# Patient Record
Sex: Female | Born: 1975 | Race: White | Hispanic: Yes | Marital: Married | Smoking: Never smoker
Health system: Southern US, Community
[De-identification: ages and names within clinical notes are randomized; demographics above are authoritative.]

---

## 1998-05-30 ENCOUNTER — Emergency Department (HOSPITAL_COMMUNITY): Admission: EM | Admit: 1998-05-30 | Discharge: 1998-05-30 | Payer: Self-pay | Admitting: Emergency Medicine

## 1999-11-07 ENCOUNTER — Emergency Department (HOSPITAL_COMMUNITY): Admission: EM | Admit: 1999-11-07 | Discharge: 1999-11-07 | Payer: Self-pay | Admitting: Internal Medicine

## 2000-01-26 ENCOUNTER — Other Ambulatory Visit: Admission: RE | Admit: 2000-01-26 | Discharge: 2000-01-26 | Payer: Self-pay | Admitting: Gynecology

## 2001-08-23 ENCOUNTER — Inpatient Hospital Stay (HOSPITAL_COMMUNITY): Admission: AD | Admit: 2001-08-23 | Discharge: 2001-08-23 | Payer: Self-pay | Admitting: Obstetrics & Gynecology

## 2002-06-11 ENCOUNTER — Inpatient Hospital Stay: Admission: AD | Admit: 2002-06-11 | Discharge: 2002-06-11 | Payer: Self-pay | Admitting: Obstetrics and Gynecology

## 2002-11-13 ENCOUNTER — Inpatient Hospital Stay (HOSPITAL_COMMUNITY): Admission: AD | Admit: 2002-11-13 | Discharge: 2002-11-15 | Payer: Self-pay | Admitting: Obstetrics and Gynecology

## 2004-06-01 ENCOUNTER — Encounter: Admission: RE | Admit: 2004-06-01 | Discharge: 2004-06-01 | Payer: Self-pay | Admitting: *Deleted

## 2004-06-03 ENCOUNTER — Ambulatory Visit: Payer: Self-pay | Admitting: *Deleted

## 2004-06-04 ENCOUNTER — Ambulatory Visit: Payer: Self-pay | Admitting: Family Medicine

## 2004-06-04 ENCOUNTER — Inpatient Hospital Stay (HOSPITAL_COMMUNITY): Admission: AD | Admit: 2004-06-04 | Discharge: 2004-06-06 | Payer: Self-pay | Admitting: *Deleted

## 2006-04-05 ENCOUNTER — Ambulatory Visit (HOSPITAL_COMMUNITY): Admission: RE | Admit: 2006-04-05 | Discharge: 2006-04-05 | Payer: Self-pay | Admitting: Family Medicine

## 2007-10-29 ENCOUNTER — Inpatient Hospital Stay (HOSPITAL_COMMUNITY): Admission: AD | Admit: 2007-10-29 | Discharge: 2007-10-29 | Payer: Self-pay | Admitting: Obstetrics

## 2007-11-14 ENCOUNTER — Inpatient Hospital Stay (HOSPITAL_COMMUNITY): Admission: AD | Admit: 2007-11-14 | Discharge: 2007-11-19 | Payer: Self-pay | Admitting: Gynecology

## 2007-11-16 ENCOUNTER — Encounter: Payer: Self-pay | Admitting: Obstetrics

## 2008-07-21 ENCOUNTER — Emergency Department (HOSPITAL_COMMUNITY): Admission: EM | Admit: 2008-07-21 | Discharge: 2008-07-21 | Payer: Self-pay | Admitting: Emergency Medicine

## 2008-12-28 ENCOUNTER — Ambulatory Visit (HOSPITAL_COMMUNITY): Admission: RE | Admit: 2008-12-28 | Discharge: 2008-12-28 | Payer: Self-pay | Admitting: Obstetrics & Gynecology

## 2009-03-19 ENCOUNTER — Inpatient Hospital Stay (HOSPITAL_COMMUNITY): Admission: RE | Admit: 2009-03-19 | Discharge: 2009-03-22 | Payer: Self-pay | Admitting: Obstetrics & Gynecology

## 2009-03-19 ENCOUNTER — Ambulatory Visit: Payer: Self-pay | Admitting: Obstetrics & Gynecology

## 2009-04-07 ENCOUNTER — Ambulatory Visit: Payer: Self-pay | Admitting: Family Medicine

## 2009-04-07 ENCOUNTER — Inpatient Hospital Stay (HOSPITAL_COMMUNITY): Admission: AD | Admit: 2009-04-07 | Discharge: 2009-04-11 | Payer: Self-pay | Admitting: Obstetrics and Gynecology

## 2009-04-12 ENCOUNTER — Inpatient Hospital Stay (HOSPITAL_COMMUNITY): Admission: AD | Admit: 2009-04-12 | Discharge: 2009-04-12 | Payer: Self-pay | Admitting: Obstetrics & Gynecology

## 2009-04-12 ENCOUNTER — Encounter: Payer: Self-pay | Admitting: *Deleted

## 2009-04-14 ENCOUNTER — Ambulatory Visit: Payer: Self-pay | Admitting: Family Medicine

## 2009-04-14 DIAGNOSIS — O871 Deep phlebothrombosis in the puerperium: Secondary | ICD-10-CM | POA: Insufficient documentation

## 2009-04-14 LAB — CONVERTED CEMR LAB: INR: 5

## 2009-04-16 ENCOUNTER — Ambulatory Visit: Payer: Self-pay | Admitting: Family Medicine

## 2009-04-19 ENCOUNTER — Ambulatory Visit: Payer: Self-pay | Admitting: Family Medicine

## 2009-04-19 LAB — CONVERTED CEMR LAB: INR: 2.9

## 2009-04-23 ENCOUNTER — Ambulatory Visit: Payer: Self-pay | Admitting: Family Medicine

## 2009-04-26 ENCOUNTER — Ambulatory Visit: Payer: Self-pay | Admitting: Family Medicine

## 2009-04-26 LAB — CONVERTED CEMR LAB: INR: 1.4

## 2009-04-29 ENCOUNTER — Ambulatory Visit: Payer: Self-pay | Admitting: Family Medicine

## 2009-05-06 ENCOUNTER — Ambulatory Visit: Payer: Self-pay | Admitting: Family Medicine

## 2009-05-06 LAB — CONVERTED CEMR LAB: INR: 2.5

## 2009-05-13 ENCOUNTER — Encounter: Payer: Self-pay | Admitting: Family Medicine

## 2009-05-13 ENCOUNTER — Ambulatory Visit: Payer: Self-pay | Admitting: Family Medicine

## 2009-05-13 DIAGNOSIS — R109 Unspecified abdominal pain: Secondary | ICD-10-CM

## 2009-05-13 DIAGNOSIS — R209 Unspecified disturbances of skin sensation: Secondary | ICD-10-CM | POA: Insufficient documentation

## 2009-05-13 DIAGNOSIS — R42 Dizziness and giddiness: Secondary | ICD-10-CM

## 2009-05-13 LAB — CONVERTED CEMR LAB: INR: 2

## 2009-05-14 LAB — CONVERTED CEMR LAB
Basophils Absolute: 0 10*3/uL (ref 0.0–0.1)
Chlamydia, DNA Probe: NEGATIVE
Eosinophils Absolute: 0.1 10*3/uL (ref 0.0–0.7)
Eosinophils Relative: 2 % (ref 0–5)
HCT: 41.6 % (ref 36.0–46.0)
Hemoglobin: 13.6 g/dL (ref 12.0–15.0)
Neutrophils Relative %: 64 % (ref 43–77)
WBC: 7.1 10*3/uL (ref 4.0–10.5)

## 2009-05-18 ENCOUNTER — Inpatient Hospital Stay (HOSPITAL_COMMUNITY): Admission: AD | Admit: 2009-05-18 | Discharge: 2009-05-19 | Payer: Self-pay | Admitting: Family Medicine

## 2009-05-19 ENCOUNTER — Ambulatory Visit: Payer: Self-pay | Admitting: Family Medicine

## 2009-05-26 ENCOUNTER — Ambulatory Visit: Payer: Self-pay | Admitting: Family Medicine

## 2009-05-26 LAB — CONVERTED CEMR LAB: INR: 2.5

## 2009-06-16 ENCOUNTER — Ambulatory Visit: Payer: Self-pay | Admitting: Family Medicine

## 2009-06-21 ENCOUNTER — Ambulatory Visit: Payer: Self-pay | Admitting: Family Medicine

## 2009-06-25 ENCOUNTER — Ambulatory Visit: Payer: Self-pay | Admitting: Family Medicine

## 2009-07-02 ENCOUNTER — Ambulatory Visit: Payer: Self-pay | Admitting: Family Medicine

## 2009-07-02 LAB — CONVERTED CEMR LAB: INR: 3.7

## 2009-07-07 ENCOUNTER — Ambulatory Visit: Payer: Self-pay | Admitting: Family Medicine

## 2009-10-15 ENCOUNTER — Encounter: Admission: RE | Admit: 2009-10-15 | Discharge: 2009-10-15 | Payer: Self-pay | Admitting: General Surgery

## 2010-01-11 ENCOUNTER — Encounter: Payer: Self-pay | Admitting: Family Medicine

## 2010-01-11 ENCOUNTER — Ambulatory Visit: Payer: Self-pay | Admitting: Family Medicine

## 2010-01-11 DIAGNOSIS — K047 Periapical abscess without sinus: Secondary | ICD-10-CM

## 2010-03-27 IMAGING — CT CT ABDOMEN W/ CM
3 of 5 series · 14 of 32 positions shown, 19 images · IV contrast (40ML OMNI-MIX & [ID] OMNIP 300%)
Comparison: None

CT ABDOMEN

CLINICAL DATA: Fever, abdominal pain, status post cesarean section
with tubal ligation and hernia repair 03/19/2009

CT ABDOMEN AND PELVIS WITH CONTRAST
TECHNIQUE: Multidetector CT imaging of the abdomen and pelvis was
performed using the standard protocol following bolus
administration of intravenous contrast.  Sagittal and coronal MPR
images reconstructed from axial data set.
Contrast: Dilute oral contrast and 100 ml Qmnipaque-STT

[Series 2: abd pelvis · axial · 0.73mm/px · z∈[-350,-65]mm · 5 of 83 slices shown, 10 images]
[im 14/83  soft-tissue]
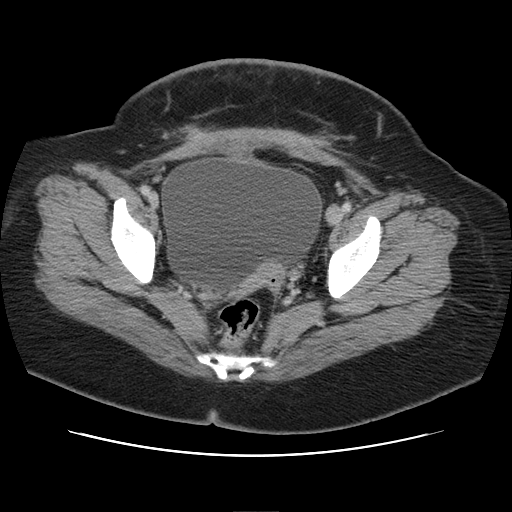
[im 14/83  bone]
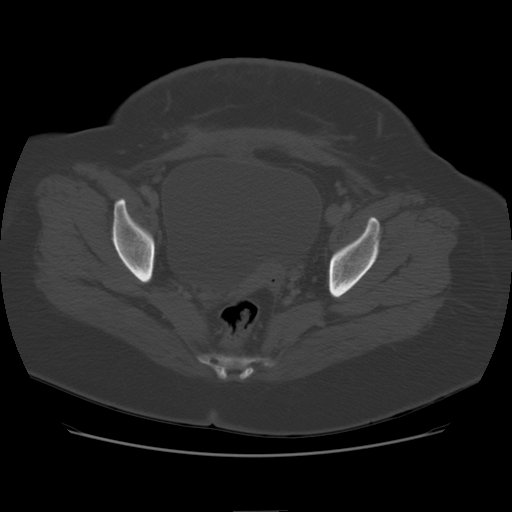
[im 28/83  soft-tissue]
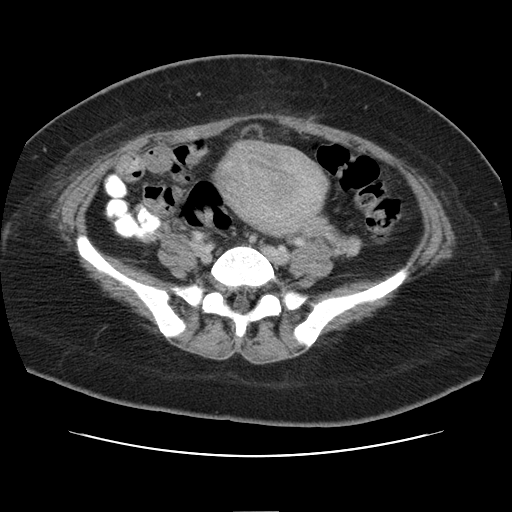
[im 28/83  lung]
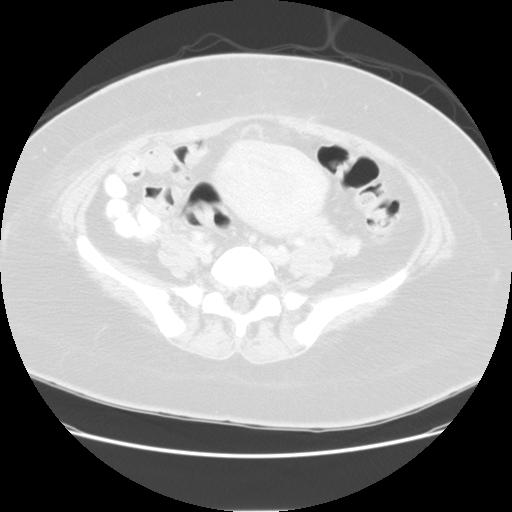
[im 42/83  soft-tissue]
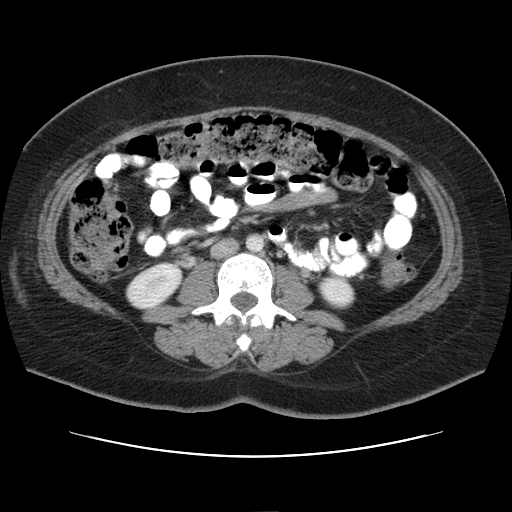
[im 42/83  lung]
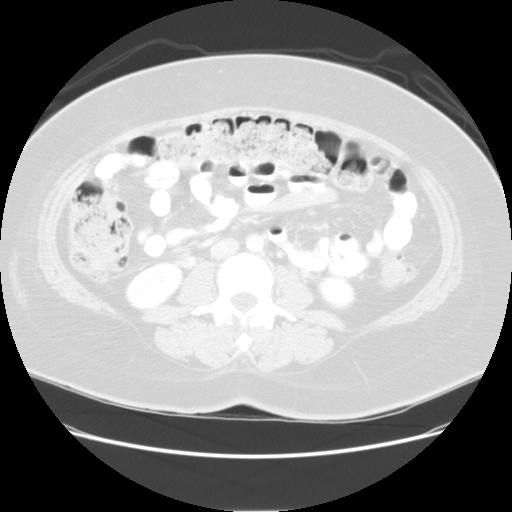
[im 55/83  soft-tissue]
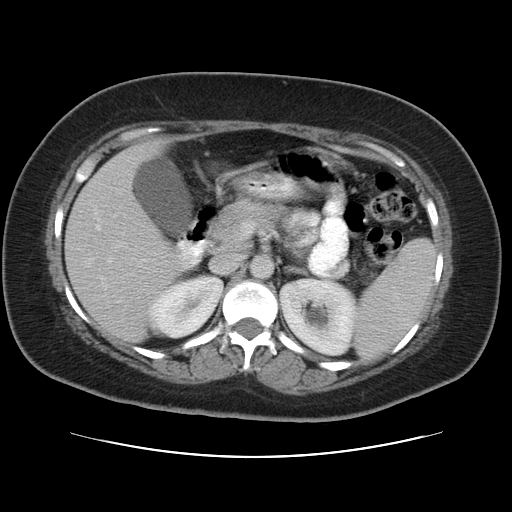
[im 55/83  lung]
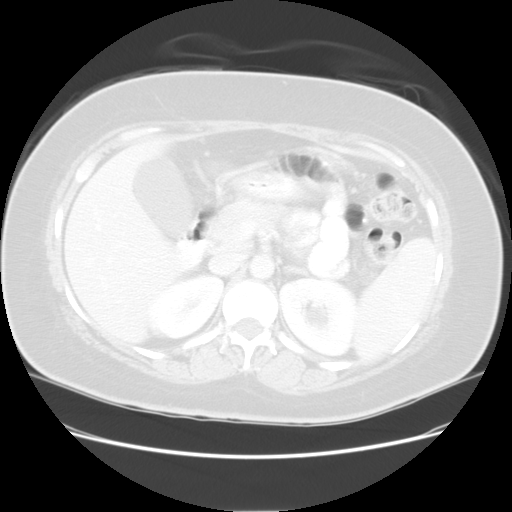
[im 69/83  soft-tissue]
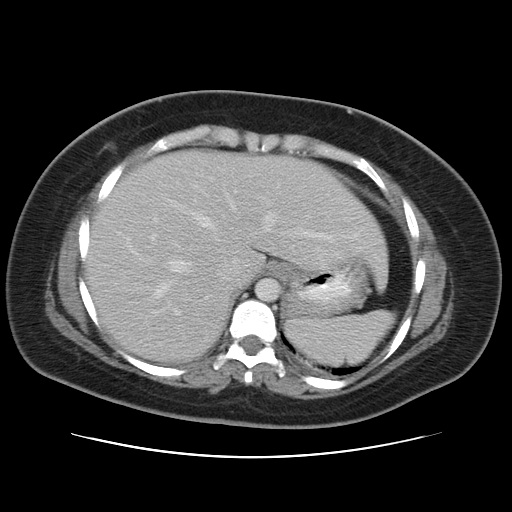
[im 69/83  lung]
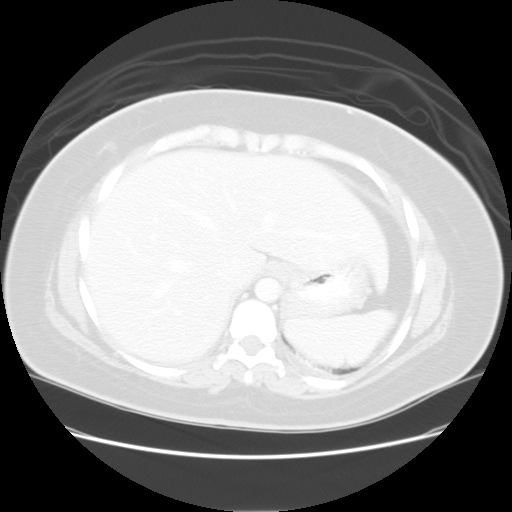

[Series 5: renal delay · axial · delayed · 0.70mm/px · z∈[-325,-155]mm · 3 of 61 slices shown]
[im 16/61  soft-tissue]
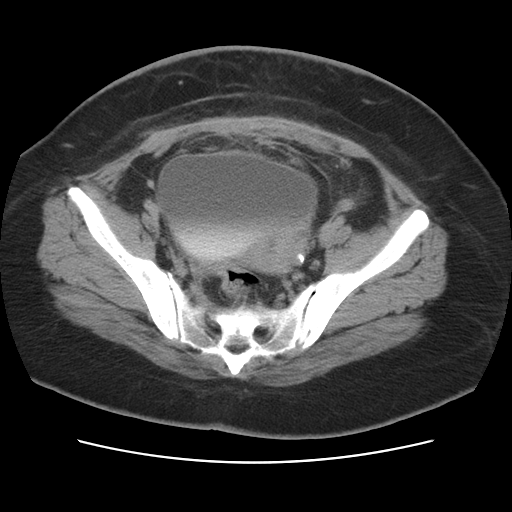
[im 31/61  soft-tissue]
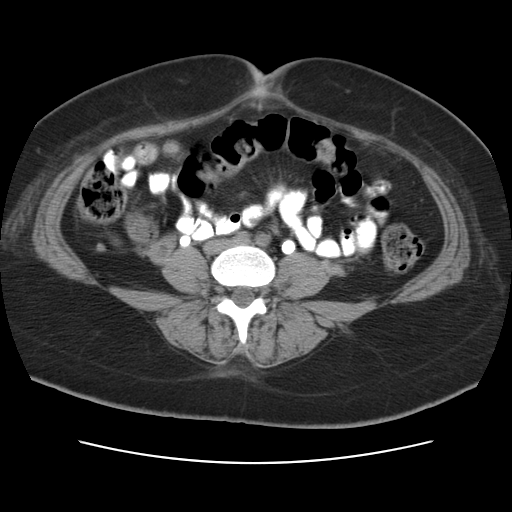
[im 46/61  soft-tissue]
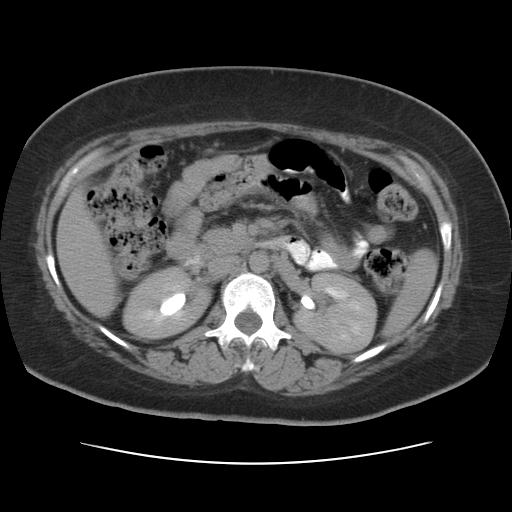

[Series 400: reformatted · coronal · 0.85mm/px · 6 of 130 slices shown]
[im 15/130  soft-tissue]
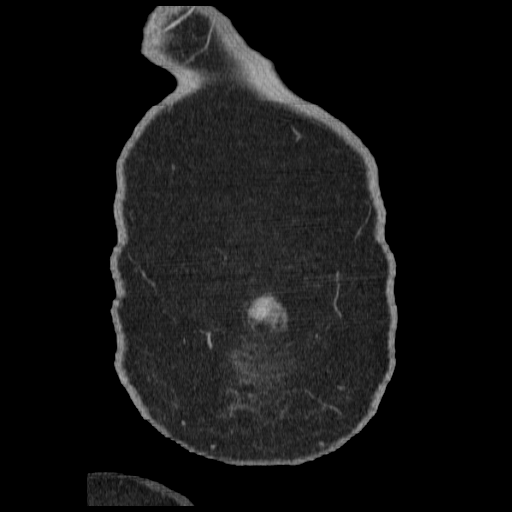
[im 29/130  soft-tissue]
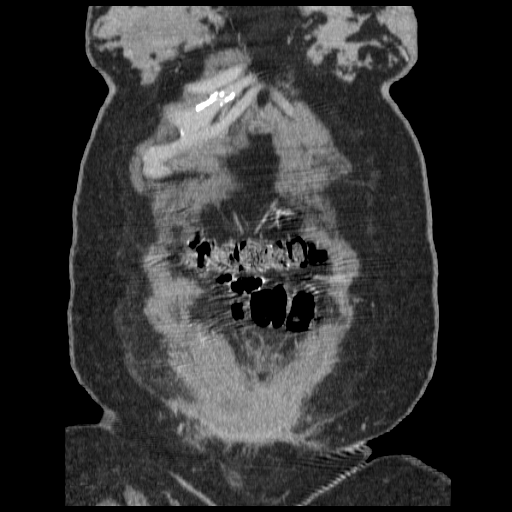
[im 44/130  soft-tissue]
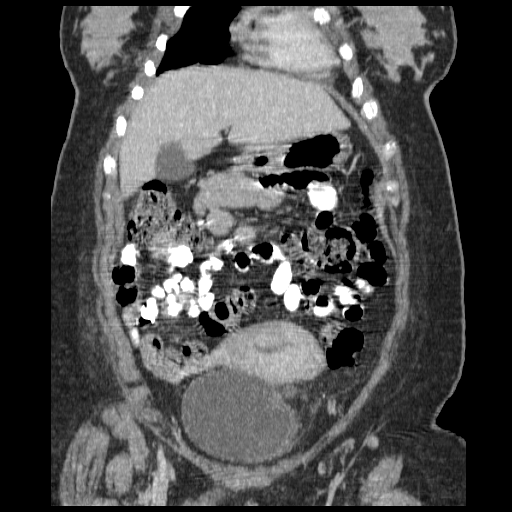
[im 58/130  soft-tissue]
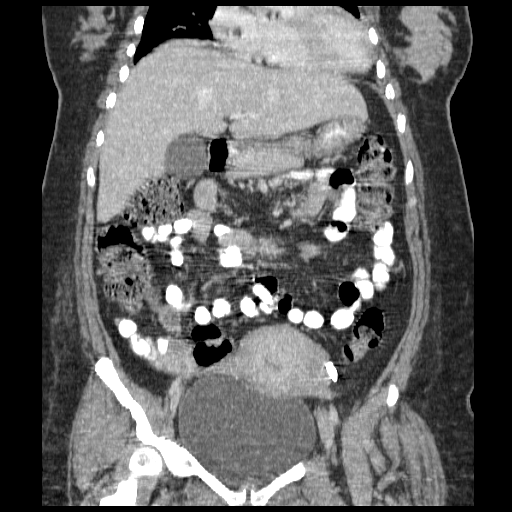
[im 72/130  soft-tissue]
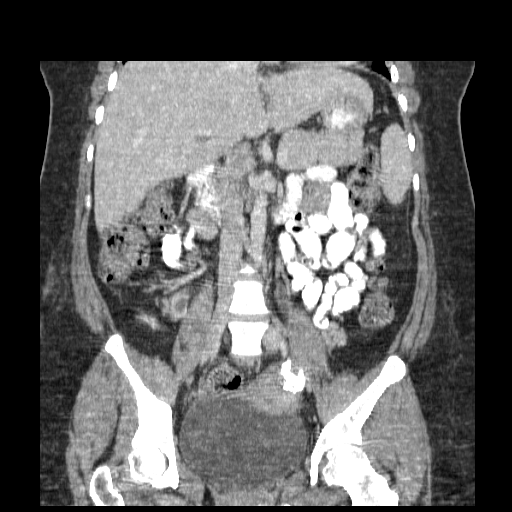
[im 87/130  soft-tissue]
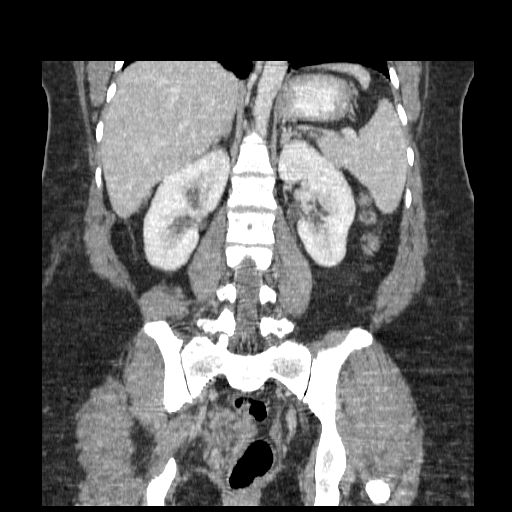

[14 of 32 positions shown; findings below may reference images not displayed]

FINDINGS: Minimal dependent atelectasis and pleural fluid at lung bases.
Liver, spleen, pancreas, and adrenal glands normal appearance.
Minimal fullness of renal collecting systems bilaterally,
potentially related to recent pregnancy.
Questionably tiny focus of gas versus minimal fat adjacent to
gallbladder image 28.
No upper abdominal mass, adenopathy, or free fluid.
Thrombus identified within right ovarian vein, nonocclusive.
No extension of thrombus into the right renal vein.
Minimal stranding at umbilicus.
IMPRESSION: Minimal bibasilar atelectasis.
Right ovarian vein thrombosis, nonocclusive.
Minimal fullness of renal collecting systems without gross
obstruction/hydronephrosis.

CT PELVIS
FINDINGS: Right ovarian vein thrombosis extends to right ovary.
Central low attenuation within uterus and slightly prominent
uterine size are likely related to postpartum state.
Calcified uterine fibroid left lateral aspect of uterus.
Stranding in prevesical space compatible with preceding surgery.
Bladder and distal ureters normal.
No pelvic mass, adenopathy, free fluid or abscess.
Pelvic bowel loops unremarkable.
Bones unremarkable.
IMPRESSION: Right ovarian vein thrombosis.
Expected postsurgical changes in pelvis with note of calcified
uterine fibroid.

Critical test results telephoned to Dr. Gantz at the time of
interpretation on 04/08/2009 at 6636 hours.

## 2010-06-09 ENCOUNTER — Ambulatory Visit: Payer: Self-pay | Admitting: Family Medicine

## 2010-06-09 ENCOUNTER — Encounter: Payer: Self-pay | Admitting: Family Medicine

## 2010-07-28 ENCOUNTER — Ambulatory Visit: Payer: Self-pay | Admitting: Family Medicine

## 2010-08-22 ENCOUNTER — Ambulatory Visit: Payer: Self-pay | Admitting: Family Medicine

## 2010-08-22 LAB — CONVERTED CEMR LAB: Pap Smear: NEGATIVE

## 2010-11-01 NOTE — Assessment & Plan Note (Signed)
Summary: dental pain/[place]/mayans   Vital Signs:  Patient profile:   35 year old female Height:      55 inches Weight:      154 pounds BMI:     35.92 Temp:     98.6 degrees F oral Pulse rate:   68 / minute BP sitting:   118 / 70  (left arm) Cuff size:   regular  Vitals Entered By: Tessie Fass CMA (January 11, 2010 2:57 PM) CC: dental pain   Primary Care Provider:  Ardeen Garland  MD  CC:  dental pain.  History of Present Illness: 85 YOF w/ 2 wk hx/o persistent dental pain. Pt states pain prominent across lower teeth in L and R jaw. Pt immigrant from [place] and states that she has never seen a Education officer, community before. Pt denies any fever. Pt does report pain w/ opening w/ mouth. However neck range of motion WNL, no nuchal rigidity. No odynophagia. Pt w/ adequate by mouth intake since swelling and pain began. HAs been using as needed tylenol for pain w/ minimal relief.   Allergies: No Known Drug Allergies  Physical Exam  General:  alert and overweight-appearing.  in minimal distress secondary to pain.  Mouth:  Erythema and swelling in L and R jaw. No tonsillar exudate.  Neck:  bilateral cervical LAD (minimal) Lungs:  normal respiratory effort, no intercostal retractions, and no accessory muscle use.     Impression & Recommendations:  Problem # 1:  ABSCESS, TOOTH (ICD-522.5) Pt w/ likely dental abscess. Plan to treat w/ Pen VK as well as as needed vicodin for pain. Referral made for dental clinic. Pt instructed that it may make time span of months to get in to clinic. Pen VK will treat tooth abscess, however if sxs recur in next 2-3 months nefore getting to dental clinic to refill Pen VK Rx and repeat treatmen course.  Orders: Dental Referral (Dentist) [facility]- Est Level  3 ([address])  Complete Medication List: 1)  Coumadin 2.5 Mg Tabs (Warfarin sodium) .... Use as directed 2)  Meclizine Hcl 25 Mg Tabs (Meclizine hcl) .... Take one by mouth two times a day as needed vertigo.  label in  spanish 3)  Omeprazole 40 Mg Cpdr (Omeprazole) .... Take one daily 4)  Tums 500 Mg Chew (Calcium carbonate antacid) .... Take one three times a day before meals. qs 1 month 5)  Penicillin V Potassium 500 Mg Tabs (Penicillin v potassium) .Marland Kitchen.. 1 pill three times a day for 14 days 6)  Vicodin 5-500 Mg Tabs (Hydrocodone-acetaminophen) .Marland Kitchen.. 1 pill every 4-6 hours for pain Prescriptions: PENICILLIN V POTASSIUM 500 MG TABS (PENICILLIN V POTASSIUM) 1 pill three times a day for 14 days  #42 x 2   Entered and Authorized by:   Doree Albee MD   Signed by:   Doree Albee MD on 01/11/2010   Method used:   Electronically to        [facility] [phone]* (retail)       [address]       [place], [place]  [address]       Ph: [phone]       Fax: [phone]   RxID:   [id number] VICODIN 5-500 MG TABS (HYDROCODONE-ACETAMINOPHEN) 1 pill every 4-6 hours for pain  #60 x 0   Entered and Authorized by:   Doree Albee MD   Signed by:   Doree Albee MD on 01/11/2010   Method used:   Printed then faxed to .Marland KitchenMarland Kitchen  [facility] [address] [phone]* (retail)       [address]       [place], [place]  [address]       Ph: [phone]       Fax: [phone]   RxID:   [id number] PENICILLIN V POTASSIUM 500 MG TABS (PENICILLIN V POTASSIUM) 1 pill three times a day for 14 days  #42 x 2   Entered and Authorized by:   Doree Albee MD   Signed by:   Doree Albee MD on 01/11/2010   Method used:   Electronically to        [facility] [phone]* (retail)       [address]       [place], [place]  [address]       Ph: [phone]       Fax: [phone]   RxID:   [id number]

## 2010-11-01 NOTE — Miscellaneous (Signed)
Summary: walk in  Clinical Lists Changes came in with another who spoke english. has dental pain & swelling on both sides of her mouth. pain x 4 days. using tylenol & ibuprofen. placed in work in & interpretor arranged.Golden Circle RN  January 11, 2010 2:21 PM

## 2010-11-01 NOTE — Miscellaneous (Signed)
Summary: walk in  Clinical Lists Changes came in c/o tooth pain on l side extending to behind her l ear. has tried tylenol & ibu with no relief. states it has been hurting 2-3 days. work in at [employer] with Dr. [place].Golden Circle RN  June 09, 2010 9:23 AM

## 2010-11-01 NOTE — Assessment & Plan Note (Signed)
Summary: complete physical   Vital Signs:  Patient profile:   35 year old female Height:      55 inches Weight:      152 pounds BMI:     35.46 Pulse (ortho):   74 / minute BP sitting:   114 / 77  (right arm)  Vitals Entered By: Renato Battles slade,cma CC: physical with pap Is Patient Diabetic? No Pain Assessment Patient in pain? no        Primary Care Earnest Mcgillis:  Ardeen Garland  MD  CC:  physical with pap.  History of Present Illness: Pt states that she is her for complete physical and has no complaints today.  At last visit was here for lower abd pain.  This has now completely resolved.  Pt states that her last pap was 1-2 years ago and that she has never had an abnormal pap.  Pt does not know if her vaccinations are uptodate.  She does not have a shot record from [place].    Habits & Providers  Alcohol-Tobacco-Diet     Tobacco Status: never  Current Medications (verified): 1)  None  Allergies (verified): No Known Drug Allergies  Family History: father- stroke mother- healthy siblings- healthy  Social History: G5P5 (youngest 03/2009).  no smoking. no alcohol. no drugs. no smoking.  has 3 years of education- was in and out of school as child since she had to stay at home and work- taught self to read.  Review of Systems       no cp, no sob, no palpitations, no bowel or bladder problems.  Physical Exam  General:  VSS Well-developed,well-nourished,in no acute distress; alert,appropriate and cooperative throughout examination Breasts:  No mass, nodules, thickening, tenderness, bulging, retraction, inflamation, nipple discharge or skin changes noted.   Lungs:  Normal respiratory effort, chest expands symmetrically. Lungs are clear to auscultation, no crackles or wheezes. Heart:  Normal rate and regular rhythm. S1 and S2 normal without gallop, murmur, click, rub or other extra sounds. Abdomen:  abdomen soft and non-tender without masses, organomegaly or hernias  noted. Genitalia:  Pelvic Exam:        External: normal female genitalia without lesions or masses        Vagina: normal without lesions or masses        Cervix: normal without lesions or masses        Adnexa: normal bimanual exam without masses or fullness        Uterus: normal by palpation        Pap smear: performed Msk:  normal gait. full rom of all ext. Extremities:  no edema Skin:  Intact without suspicious lesions or rashes Psych:  Cognition and judgment appear intact. Alert and cooperative with normal attention span and concentration. No apparent delusions, illusions, hallucinations   Impression & Recommendations:  Problem # 1:  HEALTH MAINTENANCE EXAM (ICD-V70.0) pap obtained and sent to lab.  pt does not have shot records. could review maternity records ( at another office) to see if varicella or rubella titers drawn or we consider drawing these titers if the pt was interested. will discuss further at next years wellness visit.  tetanus 2 years ago.  flu shot given today.  No lab work screening needed since pt healthy and no significant family hx.  advised weight loss.  recommended meeting with the nutritionist if she desires.   pt states her goal weight is 130 lbs.  recommended weight per bmi is 110lbs.  will aim for pt's  personal weight goal first.  pt to return as needed or in 1 year for next wellness exam.    Orders: [facility] - Est  18-39 yrs [phone])  Other Orders: Pap Smear-[facility] ([id number]) Influenza Vaccine NON MCR (86578)  Patient Instructions: 1)  return in 1 year for your next wellness exam   Orders Added: 1)  Pap Smear-[facility] [[id number]] 2)  Influenza Vaccine NON MCR [00028] 3)  [facility] - Est  18-39 yrs [[id number]]   Immunizations Administered:  Influenza Vaccine # 1:    Vaccine Type: Fluvax Non-MCR    Site: left deltoid    Mfr: GlaxoSmithKline    Dose: 0.5 ml    Route: IM    Given by: Arlyss Repress CMA,    Exp. Date: 04/01/2011    Lot #: [id number]    VIS given:  04/26/10 version given August 22, 2010.  Flu Vaccine Consent Questions:    Do you have a history of severe allergic reactions to this vaccine? no    Any prior history of allergic reactions to egg and/or gelatin? no    Do you have a sensitivity to the preservative Thimersol? no    Do you have a past history of Guillan-Barre Syndrome? no    Do you currently have an acute febrile illness? no    Have you ever had a severe reaction to latex? no    Vaccine information given and explained to patient? yes    Are you currently pregnant? no   Immunizations Administered:  Influenza Vaccine # 1:    Vaccine Type: Fluvax Non-MCR    Site: left deltoid    Mfr: GlaxoSmithKline    Dose: 0.5 ml    Route: IM    Given by: Arlyss Repress CMA,    Exp. Date: 04/01/2011    Lot #: [id number]    VIS given: 04/26/10 version given August 22, 2010.   Prevention & Chronic Care Immunizations   Influenza vaccine: Fluvax Non-MCR  (08/22/2010)    Tetanus booster: Not documented    Pneumococcal vaccine: Not documented  Other Screening   Pap smear: Not documented   Smoking status: never  (08/22/2010)

## 2010-11-01 NOTE — Assessment & Plan Note (Signed)
Summary: abdominal pain   Vital Signs:  Patient profile:   35 year old female Height:      55 inches Weight:      150 pounds BMI:     34.99 Temp:     98.5 degrees F oral Pulse rate:   72 / minute BP sitting:   104 / 73  (right arm) Cuff size:   regular  Vitals Entered By: Tessie Fass CMA (July 28, 2010 10:32 AM) CC: abdominal pain, lower x 5 days Pain Assessment Patient in pain? yes     Location: abdomen Intensity: 8   Primary Care Provider:  Ardeen Garland  MD  CC:  abdominal pain and lower x 5 days.  History of Present Illness: interview conducted in spanish with the help of an interpreter:  abdominal pain: Pt reports 5 days  of abd pain that she describes as a "burning" sensation.  The are is located below umblicus and discomort seems to run "up and down" in this area.  She also states that it sometimes feels like a "pulling" in this area.  She states that the burning and pulling sensation is worsened by activity and lifting things.  no diarrhea. no constipation.  no n/v.  no fever.  no back pain.  no urinary urgency, frequency, or retention.  Pt states that she has never had this pain in the past.    see PMH for details obtained from review of pt's medical record on echart and centricity.   Current Medications (verified): 1)  Amoxicillin 400 Mg/63ml Susr (Amoxicillin) .... 5 Ml By Mouth Three Times A Day Until You See The Dentist 2)  Oxycodone-Acetaminophen 5-325 Mg Tabs (Oxycodone-Acetaminophen) .Marland Kitchen.. 1 By Mouth Q 6 Hours As Needed For Severe Pain 3)  Ibuprofen 100 Mg/64ml Susp (Ibuprofen) .... 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  No longer on anticoagulation  6/ 2010--C/S with umbilican hernia repair and BTL  05/04/09- diagnosed with Right ovarian vein thrombosis started on coumadin  Review of  Systems       as per hpi  Physical Exam  General:  VSS Well-developed,well-nourished,in no acute distress; alert,appropriate and cooperative throughout examination Lungs:  Normal respiratory effort, chest expands symmetrically. Heart:  Normal rate and regular rhythm. S1 and S2 normal without gallop, murmur, click, rub or other extra sounds. Abdomen:  Bowel sounds positive,abdomen soft and non-tender without masses, organomegaly or hernias noted.  Minimal discomfort with palpation of area below umbilicus.   Extremities:  no edema Psych:  Cognition and judgment appear intact. Alert and cooperative with normal attention span and concentration. No apparent delusions, illusions, hallucinations   Impression & Recommendations:  Problem # 1:  ABDOMINAL PAIN OTHER SPECIFIED SITE (ICD-789.09) abdominal pain most likely 2/2 hernia repair.  Pt states that they told her that they fixed the hernia with mesh.  Most likely scar tissue surround mesh that is pulled on with lifting and activity is causing these symptoms.  Abd exam benign.  exam not consistent with reoccurance of ovarian vein thrombosis.  Pt also states that this pain is very different, and much more mild compared to the pain she had with the thrombosis.  reviewed red flags for return.  If pt continues to have this discomfort and would like further eval I will send her back to the surgeon who did the hernia  repair to get there opinion.  Otherwise for now we will do watchful waiting with treatment of discomfort with tylenol as needed.     pt to return if any new or worsening of symptoms.  pt is to due for a complete wellness exam.  Pt to schedule appt for CPE.   approx 15 min spent reviewing pt's past medical records/documentation.  Her updated medication list for this problem includes:    Oxycodone-acetaminophen 5-325 Mg Tabs (Oxycodone-acetaminophen) .Marland Kitchen... 1 by mouth q 6 hours as needed for severe pain    Ibuprofen 100 Mg/36ml Susp  (Ibuprofen) .Marland Kitchen... Take 30 ml by mouth as needed for pain every 6 hours  Orders: [facility]- Est  Level 4 ([address])  Complete Medication List: 1)  Amoxicillin 400 Mg/37ml Susr (Amoxicillin) .... 5 ml by mouth three times a day until you see the dentist 2)  Oxycodone-acetaminophen 5-325 Mg Tabs (Oxycodone-acetaminophen) .Marland Kitchen.. 1 by mouth q 6 hours as needed for severe pain 3)  Ibuprofen 100 Mg/53ml Susp (Ibuprofen) .... Take 30 ml by mouth as needed for pain every 6 hours   Orders Added: 1)  [facility]- Est  Level 4 [03474]

## 2010-11-01 NOTE — Assessment & Plan Note (Signed)
Summary: tooth pain/[place]   Vital Signs:  Patient profile:   35 year old female Height:      55 inches Weight:      150.4 pounds BMI:     35.08 Temp:     98.6 degrees F oral Pulse rate:   80 / minute BP sitting:   113 / 77  (left arm) Cuff size:   regular  Vitals Entered By: Garen Grams LPN (June 09, 2010 11:20 AM) CC: tooth pain x 2 days Is Patient Diabetic? No Pain Assessment Patient in pain? yes     Location: tooth   Primary Care Provider:  Ardeen Garland  MD  CC:  tooth pain x 2 days.  History of Present Illness: Pt with pain for 1 month, worse in past 2 days.  Feels chills, HA.  Decreased by mouth intake.  Trouble opening mouth and swallowing.   Had pain in past, saw dentist and had tooth removed. Taking apap without relief. No fevers or chills.  Habits & Providers  Alcohol-Tobacco-Diet     Tobacco Status: never  Current Medications (verified): 1)  Amoxicillin 400 Mg/72ml Susr (Amoxicillin) .... 5 Ml By Mouth Three Times A Day Until You See The Dentist 2)  Oxycodone-Acetaminophen 5-325 Mg Tabs (Oxycodone-Acetaminophen) .Marland Kitchen.. 1 By Mouth Q 6 Hours As Needed For Severe Pain 3)  Ibuprofen 100 Mg/66ml Susp (Ibuprofen) .... 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  No longer on anticoagulation  Review of Systems       see HPI  Physical Exam  General:  Well-developed,well-nourished,in no acute distress; alert,appropriate and cooperative throughout examination. Vitals noted.  Appears uncomfortable Head:  Mild facial swelling L side.  TTP, especially preauricular area.  Unable to open mouth fully, but poor dentition noted.  Molar at postion # 15 appears broken.  No swelling noted around tooth.   Impression & Recommendations:  Problem # 1:  ABSCESS, TOOTH (ICD-522.5) Needs strong pain relief.   Having trouble swallowing, so may try ibuprofen syrup and percocet tabs when able to swallow.  Will put on amox for abx since taste of susp is easier to handle than PCN suspension.  Urgent dental referral Orders: Dental Referral (Dentist) [facility]- Est Level  3 ([id number])  Complete Medication List: 1)  Amoxicillin 400 Mg/58ml Susr (Amoxicillin) .... 5 ml by mouth three times a day until you see the dentist 2)  Oxycodone-acetaminophen 5-325 Mg Tabs (Oxycodone-acetaminophen) .Marland Kitchen.. 1 by mouth q 6 hours as needed for severe pain 3)  Ibuprofen 100 Mg/67ml Susp (Ibuprofen) .... Take 30 ml by mouth as needed for pain every 6 hours Prescriptions: IBUPROFEN 100 MG/5ML SUSP (IBUPROFEN) Take 30 mL by mouth as needed for pain every 6 hours  #1 bottle x 3   Entered and Authorized by:   Charnele Semple [place] MD   Signed by:   Reiner Loewen [place] MD on 06/09/2010   Method used:   Print then Give to Patient   RxID:   [id number] OXYCODONE-ACETAMINOPHEN 5-325 MG TABS (OXYCODONE-ACETAMINOPHEN) 1 by mouth q 6 hours as needed for severe pain  #45 x 0   Entered and Authorized by:   Simmie Camerer [place] MD   Signed by:   Ayelet Gruenewald [place] MD on 06/09/2010   Method used:   Print then Give to Patient   RxID:   [id number] AMOXICILLIN 400 MG/5ML SUSR (  AMOXICILLIN) 5 mL by mouth three times a day until you see the dentist  #200 ML x 1   Entered and Authorized by:   Shawnee Higham [place] MD   Signed by:   Amybeth Sieg [place] MD on 06/09/2010   Method used:   Print then Give to Patient   RxID:   [id number]

## 2011-01-07 LAB — CBC
HCT: 38.5 % (ref 36.0–46.0)
MCHC: 34.9 g/dL (ref 30.0–36.0)
MCV: 87.3 fL (ref 78.0–100.0)
Platelets: 267 10*3/uL (ref 150–400)
RDW: 13.3 % (ref 11.5–15.5)
WBC: 9.7 10*3/uL (ref 4.0–10.5)

## 2011-01-07 LAB — COMPREHENSIVE METABOLIC PANEL
AST: 103 U/L — ABNORMAL HIGH (ref 0–37)
Alkaline Phosphatase: 155 U/L — ABNORMAL HIGH (ref 39–117)
Calcium: 9 mg/dL (ref 8.4–10.5)
Creatinine, Ser: 0.51 mg/dL (ref 0.4–1.2)
GFR calc Af Amer: >60 mL/min (ref >60)
Potassium: 4 mEq/L (ref 3.5–5.1)
Sodium: 136 mEq/L (ref 135–145)
Total Bilirubin: 0.5 mg/dL (ref 0.3–1.2)

## 2011-01-07 LAB — WET PREP, GENITAL: Trich, Wet Prep: NONE SEEN

## 2011-01-07 LAB — DIFFERENTIAL
Monocytes Absolute: 0.8 10*3/uL (ref 0.1–1.0)
Monocytes Relative: 8 % (ref 3–12)
Neutro Abs: 7 10*3/uL (ref 1.7–7.7)
Neutrophils Relative %: 72 % (ref 43–77)

## 2011-01-07 LAB — URINALYSIS, ROUTINE W REFLEX MICROSCOPIC: Specific Gravity, Urine: 1.02 (ref 1.005–1.030)

## 2011-01-07 LAB — PROTIME-INR
INR: 2.1 — ABNORMAL HIGH (ref 0.00–1.49)
Prothrombin Time: 23 seconds — ABNORMAL HIGH (ref 11.6–15.2)

## 2011-01-07 LAB — URINE MICROSCOPIC-ADD ON

## 2011-01-07 LAB — LIPASE, BLOOD: Lipase: 28 U/L (ref 11–59)

## 2011-01-08 LAB — DIFFERENTIAL
Basophils Absolute: 0 10*3/uL (ref 0.0–0.1)
Eosinophils Absolute: 0 10*3/uL (ref 0.0–0.7)
Eosinophils Relative: 0 % (ref 0–5)

## 2011-01-08 LAB — COMPREHENSIVE METABOLIC PANEL
ALT: 50 U/L — ABNORMAL HIGH (ref 0–35)
AST: 37 U/L (ref 0–37)
Alkaline Phosphatase: 98 U/L (ref 39–117)
CO2: 25 mEq/L (ref 19–32)
Chloride: 103 mEq/L (ref 96–112)
Creatinine, Ser: 0.59 mg/dL (ref 0.4–1.2)
GFR calc Af Amer: >60 mL/min (ref >60)
GFR calc non Af Amer: >60 mL/min (ref >60)
Potassium: 4 mEq/L (ref 3.5–5.1)
Sodium: 135 mEq/L (ref 135–145)
Total Bilirubin: 0.6 mg/dL (ref 0.3–1.2)

## 2011-01-08 LAB — PROTIME-INR
INR: 1.2 (ref 0.00–1.49)
INR: 2.2 — ABNORMAL HIGH (ref 0.00–1.49)

## 2011-01-08 LAB — CLOSTRIDIUM DIFFICILE EIA

## 2011-01-08 LAB — CBC
HCT: 35.9 % — ABNORMAL LOW (ref 36.0–46.0)
Hemoglobin: 12.4 g/dL (ref 12.0–15.0)
MCV: 89.9 fL (ref 78.0–100.0)
MCV: 90.2 fL (ref 78.0–100.0)
Platelets: 277 10*3/uL (ref 150–400)
RBC: 4.24 MIL/uL (ref 3.87–5.11)
RDW: 13.1 % (ref 11.5–15.5)
WBC: 4.4 10*3/uL (ref 4.0–10.5)
WBC: 7.4 10*3/uL (ref 4.0–10.5)

## 2011-01-08 LAB — FACTOR 5 ASSAY: Factor V Activity: 71 % (ref 62–140)

## 2011-01-09 LAB — URINALYSIS, ROUTINE W REFLEX MICROSCOPIC
Ketones, ur: NEGATIVE mg/dL
Nitrite: NEGATIVE
Protein, ur: NEGATIVE mg/dL
pH: 7 (ref 5.0–8.0)

## 2011-01-09 LAB — RPR: RPR Ser Ql: NONREACTIVE

## 2011-01-09 LAB — DIFFERENTIAL
Basophils Absolute: 0 10*3/uL (ref 0.0–0.1)
Basophils Relative: 0 % (ref 0–1)
Lymphocytes Relative: 16 % (ref 12–46)
Monocytes Absolute: 0.5 10*3/uL (ref 0.1–1.0)
Neutro Abs: 6.4 10*3/uL (ref 1.7–7.7)
Neutrophils Relative %: 78 % — ABNORMAL HIGH (ref 43–77)

## 2011-01-09 LAB — CBC
Hemoglobin: 10.8 g/dL — ABNORMAL LOW (ref 12.0–15.0)
Hemoglobin: 13.5 g/dL (ref 12.0–15.0)
MCHC: 34.8 g/dL (ref 30.0–36.0)
Platelets: 153 10*3/uL (ref 150–400)
RDW: 13.4 % (ref 11.5–15.5)
RDW: 13.5 % (ref 11.5–15.5)

## 2011-01-09 LAB — TYPE AND SCREEN
ABO/RH(D): O POS
Antibody Screen: NEGATIVE

## 2011-01-09 LAB — ABO/RH: ABO/RH(D): O POS

## 2011-01-28 ENCOUNTER — Emergency Department (HOSPITAL_COMMUNITY)
Admission: EM | Admit: 2011-01-28 | Discharge: 2011-01-29 | Disposition: A | Discharge status: Home / Self Care | Payer: Self-pay | Attending: Emergency Medicine | Admitting: Emergency Medicine

## 2011-01-28 ENCOUNTER — Emergency Department (HOSPITAL_COMMUNITY): Payer: Self-pay

## 2011-01-28 DIAGNOSIS — R110 Nausea: Secondary | ICD-10-CM | POA: Insufficient documentation

## 2011-01-28 DIAGNOSIS — B9689 Other specified bacterial agents as the cause of diseases classified elsewhere: Secondary | ICD-10-CM | POA: Insufficient documentation

## 2011-01-28 DIAGNOSIS — R109 Unspecified abdominal pain: Secondary | ICD-10-CM | POA: Insufficient documentation

## 2011-01-28 DIAGNOSIS — D259 Leiomyoma of uterus, unspecified: Secondary | ICD-10-CM | POA: Insufficient documentation

## 2011-01-28 DIAGNOSIS — A499 Bacterial infection, unspecified: Secondary | ICD-10-CM | POA: Insufficient documentation

## 2011-01-28 DIAGNOSIS — N760 Acute vaginitis: Secondary | ICD-10-CM | POA: Insufficient documentation

## 2011-01-28 LAB — URINALYSIS, ROUTINE W REFLEX MICROSCOPIC
Nitrite: NEGATIVE
Specific Gravity, Urine: 1.015 (ref 1.005–1.030)
Urobilinogen, UA: 0.2 mg/dL (ref 0.0–1.0)

## 2011-01-28 LAB — URINE MICROSCOPIC-ADD ON

## 2011-01-28 LAB — CBC
MCH: 29.6 pg (ref 26.0–34.0)
Platelets: 266 10*3/uL (ref 150–400)
RBC: 4.53 MIL/uL (ref 3.87–5.11)
WBC: 7.7 10*3/uL (ref 4.0–10.5)

## 2011-01-28 LAB — WET PREP, GENITAL: Yeast Wet Prep HPF POC: NONE SEEN

## 2011-01-28 LAB — DIFFERENTIAL
Basophils Relative: 0 % (ref 0–1)
Eosinophils Absolute: 0.2 10*3/uL (ref 0.0–0.7)
Monocytes Relative: 4 % (ref 3–12)
Neutrophils Relative %: 65 % (ref 43–77)

## 2011-01-28 LAB — POCT I-STAT, CHEM 8
Calcium, Ion: 1.24 mmol/L (ref 1.12–1.32)
HCT: 40 % (ref 36.0–46.0)
TCO2: 26 mmol/L (ref 0–100)

## 2011-01-28 MED ORDER — IOHEXOL 300 MG/ML  SOLN: 100.0000 mL | Freq: Once | INTRAMUSCULAR | Status: AC | PRN

## 2011-01-29 ENCOUNTER — Emergency Department (HOSPITAL_COMMUNITY): Payer: Self-pay

## 2011-02-14 NOTE — Op Note (Signed)
NAME:  [name] [name]            ACCOUNT NO.:  [id number]   MEDICAL RECORD NO.:  [id number]          PATIENT TYPE:  INP   LOCATION:  9102                          FACILITY:  [facility]   PHYSICIAN:  Charles A. Clearance Coots, M.D.DATE OF BIRTH:  [birth year]   DATE OF PROCEDURE:  11/16/2007  DATE OF DISCHARGE:                               OPERATIVE REPORT   PREOPERATIVE DIAGNOSIS:  Arrest of descent.   POSTOPERATIVE DIAGNOSIS:  Arrest of descent, uterine atony with  hemorrhage.   PROCEDURE:  Primary low transverse cesarean section.   SURGEON:  Coral Ceo, M.D.   ASSISTANT:  1. LaGrand, surgical technician.   ANESTHESIA:  Epidural.   ESTIMATED BLOOD LOSS:  2000 mL.   IV FLUIDS:  3800 mL.   URINE OUTPUT:  150 mL.   COMPLICATIONS:  None.   Foley to gravity.   FINDINGS:  Viable female at 62.  Apgars of 9 at one minute, 9 at five  minutes.  Normal uterus, ovaries and fallopian tubes.   OPERATION:  The patient was brought to the operating room and after  satisfactory redosing of the epidural the abdomen was prepped and draped  in the usual sterile fashion.  Pfannenstiel skin incision was made with  a scalpel that was deepened down to the fascia with a scalpel.  The  fascia was nicked in the midline and the fascial incision was extended  to the left and to the right with curved Mayo scissors.  The superior  and inferior fascial edges were separated from the rectus muscle with  both blunt sharp dissection.  The rectus muscles were bluntly and  sharply divided in the midline and the peritoneum was entered digitally  and was digitally extended to the left and to the right.  The bladder  blade was positioned and the vesicouterine fold of the peritoneum above  the reflection of the urinary bladder was grasped with forceps and was  incised and undermined with Metzenbaum scissors.  The incision was  extended to the left and to the right with Metzenbaum scissors.  The  bladder flap was  bluntly developed and the bladder blade was  repositioned in front of the urinary bladder placing it well out of the  operative field.  The uterus was then entered in the lower uterine  segment transversely with a scalpel.  Clear amniotic fluid was expelled.  The occiput was then brought up into the incision and the vertex was  delivered with the aid of fundal pressure from the assistant.  The  infant's mouth and nose were suctioned with a suction bulb and delivery  was completed with the aid of fundal pressure from the assistant.  The  umbilical cord was doubly clamped and cut and infant was handed off to  nursery staff.  The placenta was manually removed from the uterus  intact.  The endometrial surface was then thoroughly debrided with a dry  lap sponge.  The edges of the uterine incision were grasped with ring  forceps.  Moderate amount of uterine atony was then encountered with  hemorrhage and responded well to IV Pitocin and uterine massage.  There  was also an extension in the left aspect of the uterine incision with  some hemorrhage until the vascular bleeding could be secured with suture  ligation.  The uterus was then closed from each corner to the center  with continuous interlocking suture of 0-0 Monocryl.  Hemostasis was  excellent.  The bladder flap was then closed with a continuous suture of  3-0 Monocryl.  The pelvic cavity was thoroughly irrigated with warm  saline solution and all clots were removed.  The abdomen was then closed  as follows.  Peritoneum was closed with a continuous suture of 2-0  Monocryl.  The fascia was closed with continuous suture of 0-0 Vicryl.  Subcutaneous tissue was thoroughly irrigated with warm saline solution  and all areas of subcutaneous bleeding were coagulated with the Bovie.  The skin was then approximated with surgical stainless steel staples.  Sterile bandage was applied to the incision closure.  Surgical  technician indicated that all  needle, sponge and instrument counts were  correct x2.  The patient tolerated procedure well, was transported to  recovery room in satisfactory condition.  At the end of the procedure a  long sponge stick was not accounted for and x-ray was done to verify and  that the sponge forceps was not in the abdomen.      Charles A. Clearance Coots, M.D.  Electronically Signed     CAH/MEDQ  D:  11/16/2007  T:  11/18/2007  Job:  [id number]

## 2011-02-14 NOTE — Discharge Summary (Signed)
NAME:  April [name], Leitha            ACCOUNT NO.:  [id number]   MEDICAL RECORD NO.:  [id number]          PATIENT TYPE:  INP   LOCATION:  9102                          [facility]   PHYSICIAN:  Charles A. Clearance Coots, M.D.DATE OF BIRTH:  08/09/1976   DATE OF ADMISSION:  11/14/2007  DATE OF DISCHARGE:  11/19/2007                               DISCHARGE SUMMARY   ADMITTING DIAGNOSIS:  Term pregnancy and oligohydramnios.   DISCHARGE DIAGNOSIS:  Term pregnancy and oligohydramnios status post  primary low transverse cesarean section for arrest of descent.  Viable  female infant was delivered on November 16, 2007, at 0024.  Apgars of 9 at  1 minute and 9 at 5 minutes, weight of 3745 grams, length of 54.61 cm.  Intraoperative uterine atony with hemorrhage, postpartum severe anemia,  clinically stable at discharge.  Mother and infant discharged home in  good condition.   REASON FOR ADMISSION:  A 35 year old para 3 estimated date of  confinement of November 15, 2007, with intrauterine pregnancy at [redacted]  weeks gestation presented after a fall and ultrasound finding consistent  with oligohydramnios.  The patient was therefore admitted for induction  of labor.   PAST MEDICAL/SURGERY HISTORY:  None.   ILLNESSES:  None.   MEDICATIONS:  Prenatal vitamins.   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  Does not give  any significant history of alcohol, tobacco or recreational drug use.   FAMILY HISTORY:  No major illnesses known.   PHYSICAL EXAMINATION:  GENERAL:  Well-nourished, well-developed female  in no acute distress.  VITAL SIGNS:  She is afebrile, vital signs stable.  LUNGS:  Clear to auscultation bilaterally.  HEART: Regular rate and rhythm.  ABDOMEN:  Minimal tenderness anteriorly.  PELVIC:  Cervix 2 cm dilated, 30% effaced and vertex at -3 station.   ADMITTING LABORATORY VALUES:  Hemoglobin 10, hematocrit 31, white blood  cell count  6700, platelets 218,000, RPR was nonreactive.   HOSPITAL COURSE:  The patient was admitted and received Cervidil for  cervical ripening, followed by Pitocin per protocol.  She progressed to  full dilatation.  Vertex at -1 station and occiput posterior position.  She made no further descent of the vertex and adequate labor after -1  station and a decision was made to proceed with cesarean section  delivery for arrest of descent.  Primary low transverse cesarean section  was performed on November 16, 2007, a moderate amount of hemorrhage was  observed after delivery of the placenta which responded to the uterine  massage and IV Pitocin.  Estimated blood loss from uterine atony and  hemorrhage was approximately 2000 mL.  The patient was clinically stable  intraoperatively and postoperatively remained stable clinically without  any significant dizziness or headaches or difficulty ambulating.  She  was offered transfusion of blood products if she did become clinically  unstable, but transfusion of blood products was not necessary.  The  patient continued to do well postoperatively and was discharged home on  postop day #3 in good condition.  Her discharge laboratory values was  hemoglobin of 5, hematocrit 15, white blood cell count 10,000, platelets  164,000.   DISCHARGE DISPOSITION:   MEDICATIONS:  1. Continue prenatal vitamins.  2. Iron was prescribed for anemia.  3. Ibuprofen and Tylox were prescribed for pain.   DISCHARGE INSTRUCTIONS:  Routine written instructions were given for  discharge after cesarean section.  The patient was also given  instructions for iron rich diet.  She is to call office for a follow-up  appointment in 2 weeks.      Charles A. Clearance Coots, M.D.  Electronically Signed     CAH/MEDQ  D:  11/19/2007  T:  11/19/2007  Job:  [id number]

## 2011-02-14 NOTE — H&P (Signed)
NAME:  April [name], April [name]            ACCOUNT NO.:  [id number]   MEDICAL RECORD NO.:  [id number]          PATIENT TYPE:  INP   LOCATION:  9166                          [facility]   PHYSICIAN:  Roseanna Rainbow, M.D.DATE OF BIRTH:  02-21-1976   DATE OF ADMISSION:  11/14/2007  DATE OF DISCHARGE:                              HISTORY & PHYSICAL   CHIEF COMPLAINT:  The patient is a 35 year old para 3 with an estimated  date of confinement of November 15, 2007, with an intrauterine pregnancy  at 39 plus weeks, now status post a fall and newly documented  oligohydramnios.   HISTORY OF PRESENT ILLNESS:  Please see the above.  The patient  presented after slipping and falling on her abdomen earlier today.  A  biophysical profile was obtained and the AFI was in the 6 range.   ALLERGIES:  No known drug allergies.   MEDICATIONS:  Please see the medication reconciliation form.   PAST OBSTETRICAL HISTORY:  1. In July 1996, she was delivered of a live born female, full term.  2. In February 2004, she was delivered of a live born female, vaginal      delivery, full term.  3. In September 2005, she was delivered of a live born female, full      term, vaginal delivery.   PRENATAL LABORATORY:  Urine culture and sensitivity:  No growth.  Chlamydia probe negative.  GC probe negative.  One hour GCT 115.  GBS  negative on January 14th.  Hepatitis B surface antigen negative.  Hematocrit 37.8, hemoglobin 12.6.  HIV nonreactive.  Platelets 273,000.  Blood type is O positive.  Antibody screen negative.  RPR nonreactive.  Rubella immune.  Sickle cell negative.  An ultrasound at 21 plus weeks  concordant EDC, normal anatomy scan, no previa.   PAST GYNECOLOGIC HISTORY:  Noncontributory.   PAST MEDICAL HISTORY:  No significant history of medical diseases.   PAST SURGICAL HISTORY:  No previous surgery.   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   FAMILY HISTORY:  No major illnesses known.   PHYSICAL EXAMINATION:  VITAL SIGNS:  Stable, afebrile.  Fetal heart  tracing reassuring.  Tocodynamometer irregular uterine contractions.  GENERAL:  Mild distress.  ABDOMEN:  Minimally tender anteriorly.  PELVIC:  Sterile vaginal exam per the RN, the cervix is 2-cm dilated,  30% effaced.   ASSESSMENT:  1. Multipara at term, status post minor trauma.  2. Rh positive.  3. Oligohydramnios.  4. Borderline Bishop score.   PLAN:  1. Admission.  2. We will begin with cervical ripening with Cervidil.  3. Percocet and heating pad p.r.n.      Roseanna Rainbow, M.D.  Electronically Signed     LAJ/MEDQ  D:  11/14/2007  T:  11/14/2007  Job:  [id number]

## 2011-02-14 NOTE — Op Note (Signed)
NAME:  AMBRI, MILTNER            ACCOUNT NO.:  [id number]   MEDICAL RECORD NO.:  [id number]          PATIENT TYPE:  INP   LOCATION:  9123                          [facility]   PHYSICIAN:  Ollen Gross. Vernell Morgans, M.D. DATE OF BIRTH:  1976-07-13   DATE OF PROCEDURE:  03/19/2009  DATE OF DISCHARGE:                               OPERATIVE REPORT   PREOPERATIVE DIAGNOSIS:  Umbilical hernia.   POSTOPERATIVE DIAGNOSIS:  Umbilical hernia.   PROCEDURE:  Umbilical hernia repair.   SURGEON:  Ollen Gross. Vernell Morgans, MD   ANESTHESIA:  Epidural.   PROCEDURE:  After informed consent was obtained, the patient was brought  to the operating room, placed in the supine position on the operating  room table.  The patient was undergoing an electively scheduled C-  section.  Dr. Macon Large was performing the C-section.  She did this  through a low transverse Pfannenstiel incision.  Once the baby was  delivered and the uterus was closed and everything in the tubal was  performed and the patient was stable.  I was able to reinstitute a  Pfannenstiel incision to palpate the umbilical hernia.  It was actually  much smaller than we anticipated, it was only about maybe a centimeter  in diameter, just big enough for the tip of my finger to go through.  We  were able to grasp the fascial edges of this defect with Kocher clamps  through the Pfannenstiel incision.  We then closed the fascial defect  with the figure-of-eight #1 PDS stitch as well as with another  interrupted PDS stitch.  Once this was accomplished, the defect appeared  to be well closed.  We checked the umbilicus to make sure that we could  not see any of the stitches near the skin or feel the stitches near the  skin and we could not.  At this point, the umbilical hernia repair was  completed.  We then turned the patient back over to Dr. Macon Large who  closed the Pfannenstiel incision.  We will continue to follow her during  her postoperative period in  the hospital.  She tolerated the procedure  well.  At the end of my portion of the case all needle, sponge, and  instrument counts were correct.      Ollen Gross. Vernell Morgans, M.D.  Electronically Signed     PST/MEDQ  D:  03/19/2009  T:  03/20/2009  Job:  [id number]

## 2011-02-14 NOTE — Discharge Summary (Signed)
NAME:  April [name], April [name]            ACCOUNT NO.:  [id number]   MEDICAL RECORD NO.:  [id number]          PATIENT TYPE:  INP   LOCATION:  9305                          [facility]   PHYSICIAN:  Allie Bossier, MD        DATE OF BIRTH:  [birth year]   DATE OF ADMISSION:  04/07/2009  DATE OF DISCHARGE:  04/11/2009                               DISCHARGE SUMMARY   ADMISSION DIAGNOSES:  Pelvic pain discharge, right ovarian vein  thrombosis.   DISPOSITION:  Home.   DIET:  As tolerated.   ACTIVITY:  As tolerated.   FOLLOWUP:  Dr. Sherryll Burger at the [facility] on Monday  July 12 at 2:00 p.m.   MEDICATIONS:  1. She is to take Lovenox 80 mg subcu q.12 h.  2. Coumadin 5 mg daily.   HISTORY OF PRESENT ILLNESS AND HOSPITAL COURSE:  Ms. April [name] is a 35-  year-old, who was status post low-transverse repeat C-section and hernia  repair with mesh placement on March 17, 2009.  She arrived in the evening  of April 07, 2009 at the [facility] complaining of 3 days of worsening pelvic  pain and a low-grade fever.  Her temperature in the MAU was 100.4.  Her  vitals were stable.  A CT scan was done which showed a right ovarian  vein thrombocytic extends to the right ovary.  Her chest x-ray was  normal.  Her white count at the time of 7.4 and her hemoglobin was 13.3.  She was admitted to the hospital and begun on Zosyn in the event that  this was a postop infection from her hernia repair as well as Lovenox  and Coumadin.  She was given Percocet and morphine for pain.  She began  feeling better the following day and gradually improved on each  subsequent day.  By the day of discharge, she has been afebrile for 24  hours.  A repeat CT was done on 10th that showed improving nonocclusive  right ovarian vein thrombosis.  There were no acute other acute  abnormalities noted.  By the day of discharge, she had been afebrile for  24 hours.  She was voiding, ambulating, and tolerating p.o. without  difficulty.  She will follow up as above.      Allie Bossier, MD  Electronically Signed     MCD/MEDQ  D:  05/04/2009  T:  05/05/2009  Job:  [id number]

## 2011-02-14 NOTE — Discharge Summary (Signed)
NAME:  DEMIANA, CRUMBLEY            ACCOUNT NO.:  [id number]   MEDICAL RECORD NO.:  [id number]           PATIENT TYPE:   LOCATION:                                 FACILITY:   PHYSICIAN:  Lesly Dukes, M.D. DATE OF BIRTH:  1976-05-23   DATE OF ADMISSION:  03/17/2009  DATE OF DISCHARGE:  03/22/2009                               DISCHARGE SUMMARY   DISCHARGE DIAGNOSES:  1. Status post repeat low-transverse cesarean section.  2. Status post bilateral tubal ligation.  3. Status post umbilical hernia repair.  4. Term pregnancy, delivered.   REASON FOR ADMISSION:  Repeat low-transverse cesarean section   PROCEDURES INTRAPARTUM:  1. Cesarean section, low transverse.  2. Umbilical hernia repair.  3. Bilateral tubal ligation.   PROCEDURE POSTPARTUM:  None.   COMPLICATIONS OPERATIVE AND POSTPARTUM:  None.   BRIEF HOSPITAL COURSE:  This is a 35 year old G5, P4-0-0-4 who was  admitted for repeat low-transverse cesarean section and bilateral tubal  ligation.  The patient also had umbilical hernia repair done during the  time of delivery.  The patient delivered a viable female.  Please see  full procedure notes for the above-mentioned procedures.  She is  currently breastfeeding.  The patient needs to follow up with [facility] for umbilical hernia repair.   LABORATORY DATA:  Hemoglobin 10.8 and hematocrit 30.7 on March 20, 2009.   Blood type O positive, antibody negative, RPR nonreactive, rubella  immune, hepatitis B surface antigen negative, HIV nonreactive, and GBS  negative.   DISCHARGE INFORMATION:   ACTIVITY:  Pelvic rest x6 weeks.   DIET:  Routine.   MEDICATIONS:  1. Percocet 5/325 one tablet every 4 hours p.r.n. pain, dispensed 20,      0 refills.  2. Ibuprofen 600 mg 1 tablet q.6 h. p.r.n. pain, dispensed 30, refills      none.  3. Prenatal vitamin 1 tablet daily while breastfeeding.  4. Colace 100 mg p.o. twice daily for postpartum constipation,  dispensed 30, refills none.   STATUS:  Well.   INSTRUCTIONS:  Routine.  Discharged home.  Call [facility]  for umbilical hernia repair follow-up.   FO[facility]UP[facility]      Angeline Slim, MD      Lesly Dukes, M.D.  Electronically Signed    CT/MEDQ  D:  03/22/2009  T:  03/23/2009  Job:  [id number]

## 2011-02-14 NOTE — Op Note (Signed)
NAME:  April [name], April [name]            ACCOUNT NO.:  [id number]   MEDICAL RECORD NO.:  [id number]          PATIENT TYPE:  INP   LOCATION:  9123                          [facility]   PHYSICIAN:  Horton Chin, MD DATE OF BIRTH:  August 12, 1976   DATE OF PROCEDURE:  03/19/2009  DATE OF DISCHARGE:                               OPERATIVE REPORT   PREOPERATIVE DIAGNOSES:  1. Intrauterine pregnancy at 70 weeks' gestation.  2. Desires elective repeat cesarean section.  3. Desires permanent sterilization.  4. Symptomatic umbilical hernia.   POSTOPERATIVE DIAGNOSES:  1. Intrauterine pregnancy at 72 weeks' gestation.  2. Desires elective repeat cesarean section.  3. Desires permanent sterilization.  4. Symptomatic umbilical hernia.   PROCEDURES:  1. Repeat low transverse cesarean section.  2. Bilateral tubal sterilization using Filshie clips.  3. Umbilical hernia repair.   SURGEONS:  1. Horton Chin, MD  2. Ollen Gross. Vernell Morgans, MD   ANESTHESIOLOGIST:  Quillian Quince, MD   ANESTHESIA:  Combined spinal epidural.   INDICATIONS:  The patient is a 35 year old, gravida 5, para 4-0-0-4 at  68 weeks' gestation, who underwent a primary cesarean section during her  last delivery.  During this pregnancy, the patient desired an elective  repeat cesarean section.  She also desired permanent sterilization.  During her pregnancy, she also developed a symptomatic umbilical hernia  and desired to repair this hernia at the same time as the rest of her  surgery.  The patient was seen by Ollen Gross. Vernell Morgans, MD at [facility] and was counseled regarding the umbilical hernia  repair.  Prior to her surgery today, she was also counseled about her  repeat cesarean section and tubal sterilization.  The risks of the  surgeries were discussed with this patient including but not limited to:  bleeding which might require transfusion, infection which might require  antibiotics, injury to  surrounding internal organs, need for additional  procedures including hysterectomy in the event of a life-threatening  bleed, and 1:200 failure risk of the bilateral tubal sterilization with  increased risk of ectopic gestation if pregnancy does occur.  All  questions were answered and written informed consent was obtained with  the help of a Spanish interpreter.   FINDINGS:  Viable female infant in cephalic presentation.  Apgars were 9  and 9, weight 6 pounds 2 ounces.  There was clear amniotic fluid.  Intact placenta with 3-vessel cord.  Normal uterus and adnexa  bilaterally.  There was a small umbilical hernia that was repaired by  Dr. Carolynne Edouard.   IV FLUIDS:  2600 mL of lactated Ringer's.   URINE OUTPUT:  500 mL.   ESTIMATED BLOOD LOSS:  800 mL.   SPECIMENS:  Placenta disposed to Labor and delivery.   COMPLICATIONS:  None immediately.   PROCEDURE DETAILS:  The patient received preoperative intravenous  cefotetan and had sequential compression devices applied to her  bilateral lower extremities while in the preoperative area.  She was  then taken to the operating room where combined spinal epidural was  placed and found to be adequate.  The  patient was placed in the dorsal  supine position with a leftward tilt, and prepped and draped in a  sterile manner.  A Foley catheter was inserted into the patient's  bladder and attached to constant gravity.  Attention was then turned to  the patient's abdomen, where a Pfannenstiel incision was made over her  preexisting scar and carried down to the underlying layer of fascia with  a scalpel.  The fascia was incised in the midline, and this incision was  extended bilaterally using Mayo scissors.  Kochers were then applied to  the superior aspect of this fascial incision and the underlying rectus  muscles were dissected off bluntly and sharply.  Similar process was  carried out on the inferior aspect.  The rectus muscles were separated  in  the midline sharply, and the peritoneum was also entered sharply.  This incision was extended superiorly and inferiorly with good  visualization of the bowel and bladder.  Attention was then turned to  the lower uterine segment with a bladder flap was created and a low  transverse hysterotomy was made with a scalpel.  This hysterotomy was  extended bilaterally in a blunt fashion.  The infant's head was  encountered and delivered atraumatically.  Rest of the corpus also  delivered without complication.  The umbilical cord was clamped and cut,  and the infant was handed over to the awaiting neonatologists.  Fundal  massage was administered and the placenta delivered intact and was noted  to have a 3-vessel cord.  The uterus was then exteriorized at this point  and a dry laparotomy sponge was used to clear the uterus of all clot and  debris.  The hysterotomy was repaired using 0 Monocryl in a running  interlocking fashion.  A second layer of 0 Monocryl was used for an  imbricating layer.  Overall, good hemostasis was noted.  At this point,  attention was turned to bilateral fallopian tubes, where a Filshie clips  were placed at approximately 3-4 cm from the cornu with care given to  incorporate the underlying mesosalpinx allowing for total occlusion of  both tubes.  Hemostasis was reconfirmed around the hysterotomy area, but  at this point, there was an area on the right side of the hysterotomy  that was noted involve a small hematoma.  This area was observed and  noted not to be expanding and hemostasis was reassured.  The uterus was  then returned to the abdomen.  The pelvis and gutters were cleared of  all clot and debris and hemostasis was reassured in all surfaces.  The  hysterotomy was then reobserved and found to be stable.  At this point,  the abdominal wall was tented upwards, and Dr. Carolynne Edouard evaluated the  umbilical hernia from the peritoneal surface.  He decided to repair the  hernia  from below using few stitches of 0 PDS.  For further details of  this part of the operation, please refer to his separate dictated  operative report; no additional abdominal incision was made.  The  peritoneum and the rectus muscles were closed in a mass fashion using 2-  0 Vicryl in a running stitch and the fascia was then reapproximated  using 0 PDS in a running  stitch.  The subcutaneous layer was copiously irrigated, and the skin  was closed with staples.  The patient tolerated all procedures well.  Sponge, instrument, and needle counts were correct x3.  She was taken to  the recovery room awake  and in stable condition.      Horton Chin, MD  Electronically Signed     UAA/MEDQ  D:  03/19/2009  T:  03/20/2009  Job:  [id number]

## 2011-06-23 LAB — CBC
HCT: 31 — ABNORMAL LOW
HCT: 15 — ABNORMAL LOW
MCHC: 34.3
MCHC: 34.2
MCV: 78.3
MCV: 79.1
Platelets: 164
RBC: 3.96
RDW: 15.2

## 2011-06-23 LAB — RPR: RPR Ser Ql: NONREACTIVE

## 2011-10-23 ENCOUNTER — Ambulatory Visit (INDEPENDENT_AMBULATORY_CARE_PROVIDER_SITE_OTHER): Payer: Self-pay | Admitting: Family Medicine

## 2011-10-23 VITALS — BP 110/70 | Temp 98.5°F | Ht <= 58 in | Wt 162.0 lb

## 2011-10-23 DIAGNOSIS — J029 Acute pharyngitis, unspecified: Secondary | ICD-10-CM

## 2011-10-23 DIAGNOSIS — J020 Streptococcal pharyngitis: Secondary | ICD-10-CM | POA: Insufficient documentation

## 2011-10-23 MED ORDER — PENICILLIN V POTASSIUM 500 MG PO TABS: 500.0000 mg | ORAL_TABLET | Freq: Three times a day (TID) | ORAL | Status: AC

## 2011-10-23 NOTE — Progress Notes (Signed)
  Subjective:    Patient ID: April Day, female    DOB: 1976-04-12, 36 y.o.   MRN: [id number]  HPI Sore throat x 3 days: Difficulty swallowing due to sore throat.  occasional nausea and body aches during past 3 days. Using tylenol for throat pain.  Difficulty eating and drinking due to pain.  No runny nose.  No cold symptoms.  No cough.  No rash.  No fever.  (not checking temp at home)   Review of Systems As per above    Objective:   Physical Exam  HENT:  Head: Normocephalic and atraumatic.  Right Ear: External ear normal.  Left Ear: External ear normal.       + throat erythema.  No drainage.  No pus. No lesions.    TM normal, no erythema or fluid bilateral.   Eyes: Conjunctivae are normal. Right eye exhibits no discharge. Left eye exhibits no discharge.  Neck: Normal range of motion. Neck supple.  Cardiovascular: Normal rate, regular rhythm and normal heart sounds.   No murmur heard. Pulmonary/Chest: Effort normal. No respiratory distress. She has no wheezes. She has no rales.  Lymphadenopathy:    She has cervical adenopathy.          Assessment & Plan:

## 2011-10-23 NOTE — Assessment & Plan Note (Signed)
+   rapid strep.  Pen VK as directed.  rx sent to pharmacy.  Tylenol/motrin/chloraseptic spray as needed for symptom control.  Pt to return if no improvement in the next 2-3 days or if any new or worsening of symptoms.  Pt states understanding.   Encouraged pt to schedule regular annual physical exam.

## 2011-10-23 NOTE — Patient Instructions (Signed)
para el dolor: Motrin o tylenol como necesita. Spray de chloroseptic toma los antibiotics como indicado.

## 2011-10-27 ENCOUNTER — Encounter: Payer: Self-pay | Admitting: Family Medicine

## 2011-10-27 ENCOUNTER — Ambulatory Visit: Payer: Self-pay | Admitting: Family Medicine

## 2011-10-27 ENCOUNTER — Ambulatory Visit (INDEPENDENT_AMBULATORY_CARE_PROVIDER_SITE_OTHER): Payer: Self-pay | Admitting: Family Medicine

## 2011-10-27 DIAGNOSIS — K219 Gastro-esophageal reflux disease without esophagitis: Secondary | ICD-10-CM

## 2011-10-27 MED ORDER — RANITIDINE HCL 150 MG PO TABS: 150.0000 mg | ORAL_TABLET | Freq: Two times a day (BID) | ORAL | Status: DC

## 2011-10-27 NOTE — Patient Instructions (Signed)
Yo creo que el dolor de estomago es causada por el reflujo.  toma el medicamiento - ranitidine 2 x al dia. Fija su proxima cita en un mes.

## 2011-10-27 NOTE — Progress Notes (Signed)
  Subjective:    Patient ID: April Day, female    DOB: 04/05/1976, 36 y.o.   MRN: [id number]  HPI abd pain/bloating: x4 days.  Seen on Monday and dx with strep throat.  Started on pcn.  Took antibiotics x 2 days and then began to have abd pain in epigastric area.  Also abd felt very inflammed.  Stopped taking antibiotics.  Sore throat much improved now with only minimal discomfort.  But, abd inflammation/bloating continues.  After each meal has epigastric discomfort which she describes as burning.  Sometimes has acid taste come up in her mouth.  Seems to be worse with laying down.  Nothing seems to help this except avoiding food.  No fever.  No vomiting. No diarrheal.  No dark or bloody stools.      Review of Systems As per above    Objective:   Physical Exam  Constitutional: She appears well-developed and well-nourished.  HENT:  Head: Normocephalic and atraumatic.  Mouth/Throat: Oropharynx is clear and moist. No oropharyngeal exudate.  Cardiovascular: Normal rate, regular rhythm and normal heart sounds.   No murmur heard. Pulmonary/Chest: Effort normal. No respiratory distress. She has no wheezes. She has no rales.  Abdominal: Soft. She exhibits no distension and no mass. There is no tenderness. There is no rebound and no guarding.  Musculoskeletal: She exhibits no edema.  Neurological: She is alert.  Skin: No rash noted.  Psychiatric: She has a normal mood and affect.          Assessment & Plan:

## 2011-10-31 ENCOUNTER — Encounter: Payer: Self-pay | Admitting: Family Medicine

## 2011-10-31 DIAGNOSIS — K219 Gastro-esophageal reflux disease without esophagitis: Secondary | ICD-10-CM | POA: Insufficient documentation

## 2011-10-31 NOTE — Assessment & Plan Note (Signed)
I feel like most likely symptoms are due to reflux.  Pt to take rantidine as directed.  Return in 1 month for follow up or sooner if new or worsening of symptoms.  Pt instructed that she doesn't need to finish antibiotics since symptoms have resolved spontaneously.

## 2011-12-05 ENCOUNTER — Encounter: Payer: Self-pay | Admitting: Family Medicine

## 2012-05-21 ENCOUNTER — Other Ambulatory Visit (HOSPITAL_COMMUNITY)
Admission: RE | Admit: 2012-05-21 | Discharge: 2012-05-21 | Disposition: A | Discharge status: Home / Self Care | Payer: Self-pay | Source: Ambulatory Visit | Attending: Family Medicine | Admitting: Family Medicine

## 2012-05-21 ENCOUNTER — Other Ambulatory Visit: Payer: Self-pay | Admitting: Family Medicine

## 2012-05-21 ENCOUNTER — Ambulatory Visit (INDEPENDENT_AMBULATORY_CARE_PROVIDER_SITE_OTHER): Payer: Self-pay | Admitting: Family Medicine

## 2012-05-21 ENCOUNTER — Encounter: Payer: Self-pay | Admitting: Family Medicine

## 2012-05-21 VITALS — BP 126/71 | HR 66 | Temp 97.8°F | Wt 154.6 lb

## 2012-05-21 DIAGNOSIS — Z113 Encounter for screening for infections with a predominantly sexual mode of transmission: Secondary | ICD-10-CM | POA: Insufficient documentation

## 2012-05-21 DIAGNOSIS — N949 Unspecified condition associated with female genital organs and menstrual cycle: Secondary | ICD-10-CM

## 2012-05-21 DIAGNOSIS — R109 Unspecified abdominal pain: Secondary | ICD-10-CM

## 2012-05-21 DIAGNOSIS — R102 Pelvic and perineal pain: Secondary | ICD-10-CM

## 2012-05-21 LAB — CBC WITH DIFFERENTIAL/PLATELET
Basophils Absolute: 0 10*3/uL (ref 0.0–0.1)
Eosinophils Absolute: 0.1 10*3/uL (ref 0.0–0.7)
Lymphocytes Relative: 33 % (ref 12–46)
Lymphs Abs: 1.9 10*3/uL (ref 0.7–4.0)
Neutrophils Relative %: 58 % (ref 43–77)
Platelets: 295 10*3/uL (ref 150–400)
RBC: 4.8 MIL/uL (ref 3.87–5.11)
RDW: 12.9 % (ref 11.5–15.5)
WBC: 5.7 10*3/uL (ref 4.0–10.5)

## 2012-05-21 LAB — BASIC METABOLIC PANEL WITH GFR
BUN: 16 mg/dL (ref 6–23)
CO2: 24 meq/L (ref 19–32)
Calcium: 9.3 mg/dL (ref 8.4–10.5)
Chloride: 108 meq/L (ref 96–112)
Creat: 0.59 mg/dL (ref 0.50–1.10)
Glucose, Bld: 90 mg/dL (ref 70–99)
Potassium: 4.1 meq/L (ref 3.5–5.3)
Sodium: 139 meq/L (ref 135–145)

## 2012-05-21 LAB — POCT URINALYSIS DIPSTICK
Protein, UA: NEGATIVE
Spec Grav, UA: <=1.005
Urobilinogen, UA: 0.2
pH, UA: 7

## 2012-05-21 LAB — POCT UA - MICROSCOPIC ONLY

## 2012-05-21 LAB — POCT WET PREP (WET MOUNT): Clue Cells Wet Prep Whiff POC: NEGATIVE

## 2012-05-21 MED ORDER — IBUPROFEN 800 MG PO TABS: 800.0000 mg | ORAL_TABLET | Freq: Three times a day (TID) | ORAL | Status: DC | PRN

## 2012-05-21 NOTE — Patient Instructions (Signed)
I'm not sure exactly what is causing your pain. From an ultrasound back in April 2012, it looked like you might have had a cyst on that side. I would like for you to take 800 mg Ibuprofen three times a day (breakfast, lunch, and dinner) to help with the pain.   We will call and let you know the results of the lab tests.   I would also like to send you for another ultrasound to see if you are having trouble with another cyst.

## 2012-05-21 NOTE — Progress Notes (Signed)
  Subjective:    Patient ID: Carver Fila, female    DOB: April 29, 1976, 36 y.o.   MRN: [id number]  HPI 1.  Lower back pain and pelvic pain x 1 week:  Patient describes cramping, "pulling" pain in her pelvis for about 1 week.  This radiates to her back.  Describes as 7/10 in severity.  Describes pain as constant throughout the day, with nothing that really alleviates the pain.  Started as suprapubic pain that progressed to bilateral pelvic pain, slightly worse on left side.  Has taken Tylenol without relief.  She began to experience dysuria several days after the beginning of the pelvic pain.  She denies any bleeding, frequency, nocturia.  She does endorse hesitancy.  Does have some nausea without vomiting since this started.  No vaginal discharge or bleeding.  LMP was July 30.  Eating and drinking well.    No fevers or chills.  Also notes change to her bowel movements, usually has 2-3 bowel movements daily.  Now she is describing BM x 1 daily and straining/urging when has BM x 2 days.     Of note, in April 2012 patient had CT and [place] of abdomen for right sided pelvic pain.  Revealed calcification on Left side from possible hemorrhagic cyst.     Review of Systems See HPI above for review of systems.       Objective:   Physical Exam BP 126/71  Pulse 66  Temp 97.8 F (36.6 C) (Oral)  Wt 154 lb 9.6 oz (70.126 kg)  LMP 04/30/2012 Gen: Well NAD HEENT: EOMI,  MMM Lungs: CTABL Nl WOB Heart: RRR no MRG Abd: Soft, nondistended.  Bowel sounds good throughout.  Tender to palpation bilateral lower quadrants and suprapubic, worse Left lower quadrant and suprapubic.  Nontender BL upper quadrants or episgastrum.  Psoas/obturator signs negative.  No guarding or rebound, no radiation of pain with palpation.   Back:  Normal skin, Spine with normal alignment and no deformity.  No tenderness to vertebral process palpation.  Paraspinous muscles are not tender and without spasm.   Range of motion is full at  neck and lumbar sacral regions.  CVA tenderness present on Left.   GYN:  External genitalia within normal limits.  Vaginal mucosa pink, moist, normal rugae.  Nonfriable cervix without lesions, no discharge or bleeding noted on speculum exam.  Wet prep and GC/Chlamydia obtained. Bimanual exam revealed normal, nongravid uterus.  No cervical motion tenderness. No adnexal masses bilaterally.  Adnexal tenderness noted on Left, moderate pain.  Minimal adnexal pain on Right.     Exts: Non edematous BL  LE, warm and well perfused.         Assessment & Plan:

## 2012-05-22 DIAGNOSIS — R102 Pelvic and perineal pain: Secondary | ICD-10-CM | POA: Insufficient documentation

## 2012-05-22 MED ORDER — NITROFURANTOIN MONOHYD MACRO 100 MG PO CAPS: 100.0000 mg | ORAL_CAPSULE | Freq: Two times a day (BID) | ORAL | Status: AC

## 2012-05-22 NOTE — Assessment & Plan Note (Signed)
UA very dilute with trace blood, no glucose, and +1 bacteria.  Possibly kidney stone as combination of back and pelvic pain, though less likely as pain is bilateral.   Other possibility is ovarian cyst as she has history of this in past and had Left sided adnexal tenderness.   UTI another possibility, plan to treat with antibiotics as well as Ibuprofen for pain relief.  Awaiting urine culture.  She has all classic signs and symptoms, including back pain which is worrisome for pyelonephritis, but she's had no fevers, no nausea or vomitng, and normal white count so acute pyelo much less likely. However will treat with Macrobid if case simply cystitis.  WBC WNL.  Abdominal exam did not reveal acute abdomen.   Pelvic US to rule out ovarian cyst.  I provided the patient with explicit warnings and red flags that would prompt return to clinic or the ED.

## 2012-05-23 LAB — URINE CULTURE

## 2012-05-24 ENCOUNTER — Ambulatory Visit (HOSPITAL_COMMUNITY)
Admission: RE | Admit: 2012-05-24 | Discharge: 2012-05-24 | Disposition: A | Discharge status: Home / Self Care | Payer: Self-pay | Source: Ambulatory Visit | Attending: Family Medicine | Admitting: Family Medicine

## 2012-05-24 DIAGNOSIS — N949 Unspecified condition associated with female genital organs and menstrual cycle: Secondary | ICD-10-CM | POA: Insufficient documentation

## 2012-05-24 DIAGNOSIS — R109 Unspecified abdominal pain: Secondary | ICD-10-CM | POA: Insufficient documentation

## 2012-05-24 DIAGNOSIS — R102 Pelvic and perineal pain: Secondary | ICD-10-CM

## 2012-05-27 ENCOUNTER — Ambulatory Visit (INDEPENDENT_AMBULATORY_CARE_PROVIDER_SITE_OTHER): Payer: Self-pay | Admitting: Family Medicine

## 2012-05-27 VITALS — BP 108/69 | HR 62 | Temp 99.0°F | Ht <= 58 in | Wt 157.0 lb

## 2012-05-27 DIAGNOSIS — R109 Unspecified abdominal pain: Secondary | ICD-10-CM

## 2012-05-27 DIAGNOSIS — M549 Dorsalgia, unspecified: Secondary | ICD-10-CM

## 2012-05-27 LAB — POCT URINALYSIS DIPSTICK
Blood, UA: NEGATIVE
Glucose, UA: NEGATIVE
Spec Grav, UA: 1.015
Urobilinogen, UA: 0.2

## 2012-05-27 MED ORDER — CYCLOBENZAPRINE HCL 5 MG PO TABS: 5.0000 mg | ORAL_TABLET | Freq: Three times a day (TID) | ORAL | Status: AC | PRN

## 2012-05-27 MED ORDER — MELOXICAM 7.5 MG PO TABS: 7.5000 mg | ORAL_TABLET | Freq: Every day | ORAL | Status: DC

## 2012-05-27 NOTE — Patient Instructions (Addendum)
Se le ha indicado hoy un CT de la columna vertebral y un analisis de [place]. En cuanto esten los resultados si esta anormal yo le llamo sino se le informara de los resultados en [facility] carta. Tome los medicamentos como estan recetados y haga un cita en 1 semana si no ha mejorado o antes si otros syntomas aparecen.

## 2012-05-28 NOTE — Progress Notes (Signed)
  Subjective:    Patient ID: April Day, female    DOB: 05/08/1976, 36 y.o.   MRN: [id number]  HPI  Pt comes today for f/u abdominal pain. She is taking ibuprofen with no relieve of her symptoms. She was recently seen 8/20 and ultrasound performed 3 days after did not show any acute abnormality with stable calcification on her left ovary since previous exam. She describes the pain as nagging that is worst in the morning or when she is trying to get out of the car. Pain is located in lower abdomen but with more extensive history pt states that actually the pain seems to start on her back and irradiates to the front. She mentions has had nausea and chills though out the day, but denies fever, vomiting or diarrhea. Also complaints of  urinary symptoms of dysuria but denies frequency. She did not take Nitrofurantoin prescribed last visit. Associated to her pain she describes pinpoint areas of numbness sensation on inguinal area and medial part of her thighs bilaterally, but no weakness. Denies fecal or urinary incontinence. No saddle anesthesia reported.   Review of Systems Per HPI    Objective:   Physical Exam Gen:  NAD HEENT: Moist mucous membranes  CV: Regular rate and rhythm, no murmurs rubs or gallops PULM: Clear to auscultation bilaterally. No wheezes/rales/ ABD: Soft, appears tender on hypogastrium and left LQ but not guarding rebound tenderness. No masses palpated. Non distended, normal bowel sounds. No CVA tenderness. EXT: No edema MSK: Tenderness to pressure of vertebral processes and paraspinal muscles. No guarding.  Neuro: Alert and oriented x3. No focalization. 5/5 strength bilaterally in 4 extremities. Normal sensation. DTR WNL. Normal coordination. No  gait abnormality. Straight leg test negative bilaterally.     Assessment & Plan:

## 2012-05-29 ENCOUNTER — Inpatient Hospital Stay (HOSPITAL_COMMUNITY): Admission: RE | Admit: 2012-05-29 | Payer: Self-pay | Source: Ambulatory Visit

## 2012-05-29 ENCOUNTER — Telehealth: Payer: Self-pay | Admitting: Family Medicine

## 2012-05-29 ENCOUNTER — Ambulatory Visit (HOSPITAL_COMMUNITY)
Admission: RE | Admit: 2012-05-29 | Discharge: 2012-05-29 | Disposition: A | Discharge status: Home / Self Care | Payer: Self-pay | Source: Ambulatory Visit | Attending: Family Medicine | Admitting: Family Medicine

## 2012-05-29 DIAGNOSIS — M545 Low back pain, unspecified: Secondary | ICD-10-CM | POA: Insufficient documentation

## 2012-05-29 DIAGNOSIS — O871 Deep phlebothrombosis in the puerperium: Secondary | ICD-10-CM

## 2012-05-29 LAB — HEMOCCULT GUIAC POC 1CARD (OFFICE)
Card #3 Fecal Occult Blood, POC: POSITIVE
Fecal Occult Blood, POC: NEGATIVE

## 2012-05-29 NOTE — Addendum Note (Signed)
Addended by: Jimmy Footman K on: 05/29/2012 10:57 AM   Modules accepted: Orders

## 2012-05-29 NOTE — Telephone Encounter (Signed)
Needs to be corrected to lumbar spine w/o contrast - coming at 9:00 today

## 2012-05-29 NOTE — Assessment & Plan Note (Addendum)
No acute abdomen on physical exam today. Pain seems now more as radiation form back. Description of symptoms are very vague and pointing to multiple directions no specific of explainable medical condition. WBC WNL and U/S is non diagnostic of acute condition. Urinalysis today completely WNL and Urine Cx (clean catch)with 20 000 colonies of multiple morphotypes. I can not explain nature of pain. Plan: Ct spine to r/o stenosis even though physical exam was not positive for radiculopathy.  Ibuprofen changed to mobic 7.5mg  daily. Flexeryl added.  Reevaluate after imaging is done or sooner if worsening or new symptoms develop. If CT is normal, with pt history the possibility of somatoform disorder should not be excluded.

## 2012-05-29 NOTE — Telephone Encounter (Signed)
Order corrected in Epic to lumbar spine w/o contrast per radiology request.  Gaylene Brooks, RN

## 2012-06-14 ENCOUNTER — Other Ambulatory Visit (INDEPENDENT_AMBULATORY_CARE_PROVIDER_SITE_OTHER): Payer: Self-pay | Admitting: Family Medicine

## 2012-06-14 DIAGNOSIS — R109 Unspecified abdominal pain: Secondary | ICD-10-CM

## 2012-06-14 NOTE — Progress Notes (Signed)
Hemoccult cards returned, cannot find any notes indicating pt was given cards so unsure which date of service or which physician to connect this report to.

## 2012-06-16 ENCOUNTER — Telehealth: Payer: Self-pay | Admitting: Family Medicine

## 2012-06-16 NOTE — Telephone Encounter (Signed)
Please call patient to schedule follow up appointment with me to discuss lab results.  Thanks.

## 2012-12-26 ENCOUNTER — Emergency Department (HOSPITAL_COMMUNITY): Payer: Self-pay

## 2012-12-26 ENCOUNTER — Emergency Department (HOSPITAL_COMMUNITY)
Admission: EM | Admit: 2012-12-26 | Discharge: 2012-12-26 | Disposition: A | Discharge status: Home / Self Care | Payer: Self-pay | Attending: Emergency Medicine | Admitting: Emergency Medicine

## 2012-12-26 ENCOUNTER — Encounter (HOSPITAL_COMMUNITY): Payer: Self-pay | Admitting: Emergency Medicine

## 2012-12-26 DIAGNOSIS — R110 Nausea: Secondary | ICD-10-CM | POA: Insufficient documentation

## 2012-12-26 DIAGNOSIS — R109 Unspecified abdominal pain: Secondary | ICD-10-CM | POA: Insufficient documentation

## 2012-12-26 LAB — CBC WITH DIFFERENTIAL/PLATELET
Basophils Absolute: 0 10*3/uL (ref 0.0–0.1)
Eosinophils Absolute: 0.1 10*3/uL (ref 0.0–0.7)
Eosinophils Relative: 2 % (ref 0–5)
HCT: 37.9 % (ref 36.0–46.0)
Lymphocytes Relative: 28 % (ref 12–46)
MCH: 29.4 pg (ref 26.0–34.0)
MCHC: 33.8 g/dL (ref 30.0–36.0)
MCV: 86.9 fL (ref 78.0–100.0)
Monocytes Absolute: 0.3 10*3/uL (ref 0.1–1.0)
RDW: 12.5 % (ref 11.5–15.5)
WBC: 6.3 10*3/uL (ref 4.0–10.5)

## 2012-12-26 LAB — URINALYSIS, ROUTINE W REFLEX MICROSCOPIC
Glucose, UA: NEGATIVE mg/dL
Leukocytes, UA: NEGATIVE
Protein, ur: NEGATIVE mg/dL
Urobilinogen, UA: 0.2 mg/dL (ref 0.0–1.0)

## 2012-12-26 LAB — COMPREHENSIVE METABOLIC PANEL
AST: 40 U/L — ABNORMAL HIGH (ref 0–37)
CO2: 26 mEq/L (ref 19–32)
Calcium: 9 mg/dL (ref 8.4–10.5)
Creatinine, Ser: 0.54 mg/dL (ref 0.50–1.10)
GFR calc Af Amer: >90 mL/min (ref >90)
GFR calc non Af Amer: >90 mL/min (ref >90)
Total Protein: 6.6 g/dL (ref 6.0–8.3)

## 2012-12-26 MED ORDER — SODIUM CHLORIDE 0.9 % IV BOLUS (SEPSIS)
1000.0000 mL | Freq: Once | INTRAVENOUS | Status: AC
  Administered 2012-12-26: 1000 mL via INTRAVENOUS

## 2012-12-26 MED ORDER — PROMETHAZINE HCL 25 MG PO TABS: 25.0000 mg | ORAL_TABLET | Freq: Four times a day (QID) | ORAL | Status: AC | PRN

## 2012-12-26 MED ORDER — MORPHINE SULFATE 4 MG/ML IJ SOLN
4.0000 mg | Freq: Once | INTRAMUSCULAR | Status: AC
  Administered 2012-12-26: 4 mg via INTRAVENOUS
  Filled 2012-12-26: qty 1

## 2012-12-26 MED ORDER — ONDANSETRON HCL 4 MG/2ML IJ SOLN
4.0000 mg | Freq: Once | INTRAMUSCULAR | Status: AC
  Administered 2012-12-26: 4 mg via INTRAVENOUS
  Filled 2012-12-26: qty 2

## 2012-12-26 MED ORDER — POLYETHYLENE GLYCOL 3350 17 GM/SCOOP PO POWD: 17.0000 g | Freq: Two times a day (BID) | ORAL | Status: AC

## 2012-12-26 NOTE — ED Provider Notes (Signed)
History     CSN: [id number]  Arrival date & time 12/26/12  7829   First MD Initiated Contact with Patient 12/26/12 [phone]      Chief Complaint  Patient presents with  . Abdominal Pain    (Consider location/radiation/quality/duration/timing/severity/associated sxs/prior treatment) HPI Comments: Patient is a 37 year old female who presents with a 2 day history of abdominal pain. The pain is located on the right side of her abdomen and does not radiate. The pain is described as aching and severe without radiation. The pain started gradually and progressively worsened since the onset. Eating makes the pain worse. No alleviating factors. The patient has tried nothing for symptoms without relief. Associated symptoms include abdominal distention and nausea. Patient denies fever, headache, vomiting, diarrhea, chest pain, SOB, dysuria, constipation, abnormal vaginal bleeding/discharge.     Patient is a 37 y.o. female presenting with abdominal pain.  Abdominal Pain Associated symptoms: nausea     History reviewed. No pertinent past medical history.  Past Surgical History  Procedure Laterality Date  . Cesarean section with bilateral tubal ligation      No family history on file.  History  Substance Use Topics  . Smoking status: Never Smoker   . Smokeless tobacco: Not on file  . Alcohol Use: No    OB History   Grav Para Term Preterm Abortions TAB SAB Ect Mult Living                  Review of Systems  Gastrointestinal: Positive for nausea, abdominal pain and abdominal distention.  All other systems reviewed and are negative.    Allergies  Review of patient's allergies indicates no known allergies.  Home Medications  No current outpatient prescriptions on file.  Pulse 66  Temp(Src) 97.7 F (36.5 C) (Oral)  Resp 24  Ht 4\' 11"  (1.499 m)  Wt 160 lb (72.576 kg)  BMI 32.3 kg/m2  SpO2 100%  LMP 12/11/2012  Physical Exam  Nursing note and vitals  reviewed. Constitutional: She is oriented to person, place, and time. She appears well-developed and well-nourished. No distress.  HENT:  Head: Normocephalic and atraumatic.  Eyes: Conjunctivae and EOM are normal. Pupils are equal, round, and reactive to light. No scleral icterus.  Neck: Normal range of motion. Neck supple.  Cardiovascular: Normal rate and regular rhythm.  Exam reveals no gallop and no friction rub.   No murmur heard. Pulmonary/Chest: Effort normal and breath sounds normal. She has no wheezes. She has no rales. She exhibits no tenderness.  Abdominal: Soft. She exhibits no distension. There is tenderness. There is no rebound and no guarding.  RUQ and right side abdominal pain.   Musculoskeletal: Normal range of motion.  Neurological: She is alert and oriented to person, place, and time. Coordination normal.  Speech is goal-oriented. Moves limbs without ataxia.   Skin: Skin is warm and dry.  Psychiatric: She has a normal mood and affect. Her behavior is normal.    ED Course  Procedures (including critical care time)  Labs Reviewed  COMPREHENSIVE METABOLIC PANEL - Abnormal; Notable for the following:    AST 40 (*)    ALT 72 (*)    Total Bilirubin 0.2 (*)    All other components within normal limits  URINE CULTURE  CBC WITH DIFFERENTIAL  LIPASE, BLOOD  URINALYSIS, ROUTINE W REFLEX MICROSCOPIC  POCT PREGNANCY, URINE   US Abdomen Complete  12/26/2012  *RADIOLOGY REPORT*  Clinical Data:  37 year old female with right upper quadrant pain.  COMPLETE ABDOMINAL ULTRASOUND  Comparison:  CT abdomen and pelvis 01/20/2011.  Findings:  Gallbladder:  No gallstones, gallbladder wall thickening, or pericholecystic fluid. No sonographic Murphy's sign elicited.  Common bile duct:  Normal measuring 2 mm.  Liver:  No focal lesion identified.  Within normal limits in parenchymal echogenicity.  IVC:  Appears normal.  Pancreas:  No focal abnormality seen.  Spleen:  Normal measuring 6.8 cm in  length.  Right Kidney:  Normal measuring 12 cm in length.  Left Kidney:  Normal measuring 12.1 cm in length.  Abdominal aorta:  Incompletely visualized due to overlying bowel gas, visualized segments are within normal limits.  IMPRESSION: Normal gallbladder. Negative abdominal ultrasound.   Original Report Authenticated By: Erskine Speed, M.D.    Dg Abd Acute W/chest  12/26/2012  *RADIOLOGY REPORT*  Clinical Data: 37 year old female lower abdominal pain with nausea.  ACUTE ABDOMEN SERIES (ABDOMEN 2 VIEW & CHEST 1 VIEW)  Comparison: None.  Findings: Mildly low lung volumes.  Cardiac size and mediastinal contours are within normal limits.  Visualized tracheal air column is within normal limits.  No pneumothorax or pneumoperitoneum.  No confluent pulmonary opacity.  Nonobstructed bowel gas pattern.  Tubal ligation clips in the pelvis.  Near the left tubal ligation clip is at 3 cm area of flocculated calcium likely related to a calcified uterine fibroid. No acute osseous abnormality identified.  IMPRESSION: 1. Nonobstructed bowel gas pattern, no free air. 2. No acute cardiopulmonary abnormality. 3. Calcified uterine fibroid suspected.   Original Report Authenticated By: Erskine Speed, M.D.      1. Abdominal pain       MDM  1:42 PM Labs unremarkable. RUQ ultrasound unremarkable. Patient will have morphine, fluids, and acute abdominal series. I am now ordering acute abdominal series because the patient now tells me she is unable to pass gas.   2:35 PM Acute abdominal series shows no obstruction. Patient likely having pain from constipation. I will discharge the patient with Miralax prescription and instructions to follow up with PCP. Vitals stable and patient afebrile. No further evaluation needed at this time. Patient instructed to return with worsening or concerning symptoms.       Emilia Beck, PA-C 12/26/12 1439

## 2012-12-26 NOTE — ED Notes (Signed)
Pt c/o abd distension and pain x 2 days,

## 2012-12-26 NOTE — ED Notes (Signed)
Pt transported to US

## 2012-12-27 ENCOUNTER — Encounter (HOSPITAL_COMMUNITY): Payer: Self-pay | Admitting: *Deleted

## 2012-12-27 DIAGNOSIS — K5900 Constipation, unspecified: Secondary | ICD-10-CM | POA: Insufficient documentation

## 2012-12-27 DIAGNOSIS — R141 Gas pain: Secondary | ICD-10-CM | POA: Insufficient documentation

## 2012-12-27 DIAGNOSIS — R142 Eructation: Secondary | ICD-10-CM | POA: Insufficient documentation

## 2012-12-27 DIAGNOSIS — R079 Chest pain, unspecified: Secondary | ICD-10-CM | POA: Insufficient documentation

## 2012-12-27 DIAGNOSIS — Z3202 Encounter for pregnancy test, result negative: Secondary | ICD-10-CM | POA: Insufficient documentation

## 2012-12-27 LAB — CBC WITH DIFFERENTIAL/PLATELET
Eosinophils Relative: 2 % (ref 0–5)
HCT: 40.3 % (ref 36.0–46.0)
Lymphocytes Relative: 30 % (ref 12–46)
Lymphs Abs: 2.6 10*3/uL (ref 0.7–4.0)
MCV: 87 fL (ref 78.0–100.0)
Monocytes Absolute: 0.7 10*3/uL (ref 0.1–1.0)
RBC: 4.63 MIL/uL (ref 3.87–5.11)
WBC: 8.6 10*3/uL (ref 4.0–10.5)

## 2012-12-27 LAB — URINALYSIS, ROUTINE W REFLEX MICROSCOPIC
Hgb urine dipstick: NEGATIVE
Ketones, ur: NEGATIVE mg/dL
Protein, ur: NEGATIVE mg/dL
Urobilinogen, UA: 0.2 mg/dL (ref 0.0–1.0)

## 2012-12-27 LAB — PREGNANCY, URINE: Preg Test, Ur: NEGATIVE

## 2012-12-27 LAB — URINE CULTURE

## 2012-12-27 NOTE — ED Notes (Signed)
Pt c/o chest and abd pain since 1800 today no nv or diarrhea.  lmp 2 weeks ago

## 2012-12-28 ENCOUNTER — Emergency Department (HOSPITAL_COMMUNITY)
Admission: EM | Admit: 2012-12-28 | Discharge: 2012-12-28 | Disposition: A | Discharge status: Home / Self Care | Payer: Self-pay | Attending: Emergency Medicine | Admitting: Emergency Medicine

## 2012-12-28 DIAGNOSIS — K5900 Constipation, unspecified: Secondary | ICD-10-CM

## 2012-12-28 LAB — COMPREHENSIVE METABOLIC PANEL
ALT: 55 U/L — ABNORMAL HIGH (ref 0–35)
CO2: 28 mEq/L (ref 19–32)
Calcium: 9.7 mg/dL (ref 8.4–10.5)
Creatinine, Ser: 0.64 mg/dL (ref 0.50–1.10)
GFR calc Af Amer: >90 mL/min (ref >90)
GFR calc non Af Amer: >90 mL/min (ref >90)
Glucose, Bld: 88 mg/dL (ref 70–99)
Sodium: 142 mEq/L (ref 135–145)

## 2012-12-28 MED ORDER — MINERAL OIL RE ENEM
1.0000 | ENEMA | Freq: Once | RECTAL | Status: DC
  Filled 2012-12-28: qty 1

## 2012-12-28 MED ORDER — MORPHINE SULFATE 4 MG/ML IJ SOLN
2.0000 mg | Freq: Once | INTRAMUSCULAR | Status: AC
  Administered 2012-12-28: 2 mg via INTRAVENOUS
  Filled 2012-12-28: qty 1

## 2012-12-28 MED ORDER — SODIUM CHLORIDE 0.9 % IV BOLUS (SEPSIS)
1000.0000 mL | Freq: Once | INTRAVENOUS | Status: AC
  Administered 2012-12-28: 1000 mL via INTRAVENOUS

## 2012-12-28 MED ORDER — BISACODYL 10 MG RE SUPP
10.0000 mg | Freq: Once | RECTAL | Status: AC
  Administered 2012-12-28: 10 mg via RECTAL
  Filled 2012-12-28: qty 1

## 2012-12-28 MED ORDER — LACTULOSE 10 GM/15ML PO SOLN
30.0000 g | Freq: Once | ORAL | Status: AC
  Administered 2012-12-28: 30 g via ORAL
  Filled 2012-12-28: qty 45

## 2012-12-28 MED ORDER — FLEET ENEMA 7-19 GM/118ML RE ENEM
1.0000 | ENEMA | Freq: Once | RECTAL | Status: AC
  Administered 2012-12-28: 1 via RECTAL
  Filled 2012-12-28: qty 1

## 2012-12-28 NOTE — ED Notes (Signed)
ED provider in room with pt 

## 2012-12-28 NOTE — ED Provider Notes (Signed)
History     CSN: [id number]  Arrival date & time 12/27/12  2308   First MD Initiated Contact with Patient 12/28/12 0201      Chief Complaint  Patient presents with  . Abdominal Pain    (Consider location/radiation/quality/duration/timing/severity/associated sxs/prior treatment) Patient is a 37 y.o. female presenting with abdominal pain. The history is provided by the patient and a relative. The history is limited by a language barrier. A language interpreter was used (her son).  Abdominal Pain Pain location:  Generalized Pain quality: cramping and sharp   Pain radiates to:  L flank and R flank Pain severity:  Severe Onset quality:  Gradual Timing:  Constant Progression:  Unchanged Chronicity:  New Relieved by:  Nothing Worsened by:  Movement, deep breathing and position changes Ineffective treatments:  Position changes Associated symptoms: chest pain, constipation and flatus   Associated symptoms: no diarrhea, no dysuria, no fever, no nausea and no vomiting   Pt presenting with generalized abdominal pain.  Was seen yesterday for extensive workup of similar pain.  Labs and U/S were nl.  Acute abdominal series was suggestive of constipation.  Pt last BM was yesterday around 1600.  Pt has not tried anything for pain.  Movement and deep breathing make pain worse.  Pain even goes into her chest.  States it feels difficult to breath.  Denies nausea or vomiting.  Denies fever or chills.  PMH sig for hernia repair.  History reviewed. No pertinent past medical history.  Past Surgical History  Procedure Laterality Date  . Cesarean section with bilateral tubal ligation      No family history on file.  History  Substance Use Topics  . Smoking status: Never Smoker   . Smokeless tobacco: Not on file  . Alcohol Use: No    OB History   Grav Para Term Preterm Abortions TAB SAB Ect Mult Living                  Review of Systems  Constitutional: Negative for fever.   Cardiovascular: Positive for chest pain.  Gastrointestinal: Positive for abdominal pain, constipation and flatus. Negative for nausea, vomiting and diarrhea.  Genitourinary: Negative for dysuria.    Allergies  Review of patient's allergies indicates no known allergies.  Home Medications   Current Outpatient Rx  Name  Route  Sig  Dispense  Refill  . polyethylene glycol powder (GLYCOLAX/MIRALAX) powder   Oral   Take 17 g by mouth 2 (two) times daily. Until daily soft stools  OTC   255 g   0   . promethazine (PHENERGAN) 25 MG tablet   Oral   Take 1 tablet (25 mg total) by mouth every 6 (six) hours as needed for nausea.   12 tablet   0     BP 107/58  Pulse 71  Temp(Src) 98.3 F (36.8 C)  Resp 20  SpO2 100%  LMP 12/11/2012  Physical Exam  Nursing note and vitals reviewed. Constitutional: She appears well-developed and well-nourished.  HENT:  Head: Normocephalic and atraumatic.  Eyes: Conjunctivae are normal. No scleral icterus.  Neck: Normal range of motion. Neck supple.  Cardiovascular: Normal rate, regular rhythm and normal heart sounds.   Pulmonary/Chest: Effort normal and breath sounds normal. No respiratory distress. She has no wheezes. She has no rales. She exhibits tenderness ( TTP throughout chest).  Abdominal: Soft. Bowel sounds are normal. She exhibits distension. She exhibits no mass. There is tenderness ( moderate TTP throughout). There is no  rebound and no guarding.  Musculoskeletal: Normal range of motion. She exhibits tenderness ( lower back TTP).  Neurological: She is alert.  Skin: Skin is warm and dry. No rash noted. No erythema.    ED Course  Procedures (including critical care time)  Labs Reviewed  COMPREHENSIVE METABOLIC PANEL - Abnormal; Notable for the following:    ALT 55 (*)    Total Bilirubin 0.2 (*)    All other components within normal limits  URINALYSIS, ROUTINE W REFLEX MICROSCOPIC - Abnormal; Notable for the following:     APPearance CLOUDY (*)    All other components within normal limits  CBC WITH DIFFERENTIAL  LIPASE, BLOOD  PREGNANCY, URINE   US Abdomen Complete  12/26/2012  *RADIOLOGY REPORT*  Clinical Data:  37 year old female with right upper quadrant pain.  COMPLETE ABDOMINAL ULTRASOUND  Comparison:  CT abdomen and pelvis 01/20/2011.  Findings:  Gallbladder:  No gallstones, gallbladder wall thickening, or pericholecystic fluid. No sonographic Murphy's sign elicited.  Common bile duct:  Normal measuring 2 mm.  Liver:  No focal lesion identified.  Within normal limits in parenchymal echogenicity.  IVC:  Appears normal.  Pancreas:  No focal abnormality seen.  Spleen:  Normal measuring 6.8 cm in length.  Right Kidney:  Normal measuring 12 cm in length.  Left Kidney:  Normal measuring 12.1 cm in length.  Abdominal aorta:  Incompletely visualized due to overlying bowel gas, visualized segments are within normal limits.  IMPRESSION: Normal gallbladder. Negative abdominal ultrasound.   Original Report Authenticated By: Erskine Speed, M.D.    Dg Abd Acute W/chest  12/26/2012  *RADIOLOGY REPORT*  Clinical Data: 37 year old female lower abdominal pain with nausea.  ACUTE ABDOMEN SERIES (ABDOMEN 2 VIEW & CHEST 1 VIEW)  Comparison: None.  Findings: Mildly low lung volumes.  Cardiac size and mediastinal contours are within normal limits.  Visualized tracheal air column is within normal limits.  No pneumothorax or pneumoperitoneum.  No confluent pulmonary opacity.  Nonobstructed bowel gas pattern.  Tubal ligation clips in the pelvis.  Near the left tubal ligation clip is at 3 cm area of flocculated calcium likely related to a calcified uterine fibroid. No acute osseous abnormality identified.  IMPRESSION: 1. Nonobstructed bowel gas pattern, no free air. 2. No acute cardiopulmonary abnormality. 3. Calcified uterine fibroid suspected.   Original Report Authenticated By: Erskine Speed, M.D.      1. Constipation       MDM  Pt  presenting with diffuse abdominal pain.  Was seen yesterday in ED and received a full abdominal work up with negative labs and [place].  Acute abdominal series showed no obstruction but suggestive of constipation.  Pt last had a BM yesterday afternoon.  I believe she is still constipated.    Will try enema. Gave morphine for pain and fluids.   5:33 AM Pt states she has had 1 BM and is feeling somewhat better.    Gave pt option of going home with laxative tx or staying here.  Pt decided to stay until pain improved more.  Will try dulcolax 10mg  and lactulose 30g  5:33 AM  Pt has had 2 BM since being in ED.  Still c/o abdominal pain.  No reason to keep pt here at this time due to nl workup yesterday for same complaint.  Will discharge pt home and advised her to keep taking Glycolax/miralax powder.  Pain may take a few days to subside, once bowels are cleaned out.  Pt should follow  up with PCP for abdominal pain and constipation.   Vitals: unremarkable. Discharged in stable condition.    Discussed pt with attending during ED encounter.         Junius Finner, PA-C 12/28/12 [phone]

## 2012-12-28 NOTE — ED Provider Notes (Signed)
Medical screening examination/treatment/procedure(s) were performed by non-physician practitioner and as supervising physician I was immediately available for consultation/collaboration.  Asif Muchow, MD 12/28/12 1651 

## 2013-01-15 NOTE — ED Provider Notes (Signed)
Medical screening examination/treatment/procedure(s) were performed by non-physician practitioner and as supervising physician I was immediately available for consultation/collaboration.  Julie Manly, MD 01/15/13 0818 

## 2013-01-27 ENCOUNTER — Emergency Department (INDEPENDENT_AMBULATORY_CARE_PROVIDER_SITE_OTHER)
Admission: EM | Admit: 2013-01-27 | Discharge: 2013-01-27 | Disposition: A | Discharge status: Home / Self Care | Payer: Self-pay | Source: Home / Self Care | Attending: Emergency Medicine | Admitting: Emergency Medicine

## 2013-01-27 ENCOUNTER — Encounter (HOSPITAL_COMMUNITY): Payer: Self-pay | Admitting: Emergency Medicine

## 2013-01-27 DIAGNOSIS — M5412 Radiculopathy, cervical region: Secondary | ICD-10-CM

## 2013-01-27 DIAGNOSIS — G5601 Carpal tunnel syndrome, right upper limb: Secondary | ICD-10-CM

## 2013-01-27 DIAGNOSIS — G5600 Carpal tunnel syndrome, unspecified upper limb: Secondary | ICD-10-CM

## 2013-01-27 MED ORDER — PREDNISONE 10 MG PO TABS: ORAL_TABLET | ORAL | Status: DC

## 2013-01-27 MED ORDER — IBUPROFEN 800 MG PO TABS
ORAL_TABLET | ORAL | Status: AC
  Filled 2013-01-27: qty 1

## 2013-01-27 MED ORDER — OXYCODONE-ACETAMINOPHEN 5-325 MG PO TABS: ORAL_TABLET | ORAL | Status: AC

## 2013-01-27 MED ORDER — MELOXICAM 15 MG PO TABS: 15.0000 mg | ORAL_TABLET | Freq: Every day | ORAL | Status: AC

## 2013-01-27 MED ORDER — METHYLPREDNISOLONE ACETATE 80 MG/ML IJ SUSP
INTRAMUSCULAR | Status: AC
  Filled 2013-01-27: qty 1

## 2013-01-27 MED ORDER — METHYLPREDNISOLONE ACETATE 80 MG/ML IJ SUSP
80.0000 mg | Freq: Once | INTRAMUSCULAR | Status: AC
  Administered 2013-01-27: 80 mg via INTRAMUSCULAR

## 2013-01-27 MED ORDER — IBUPROFEN 800 MG PO TABS
800.0000 mg | ORAL_TABLET | Freq: Once | ORAL | Status: AC
  Administered 2013-01-27: 800 mg via ORAL

## 2013-01-27 NOTE — ED Notes (Signed)
Pt c/o right wrist pain onset 2 months Denies: f/v/n/d, inj/trauma Reports swelling, numbness/tingly of right wrist, pain radiates upward to right arm and side of neck Pain increases w/acitivity; pain is 10/10 on pain scale at the moment Bought a wrist brace that helps ease the pain  She is alert and oriented w/no signs of acute distress.

## 2013-01-27 NOTE — ED Provider Notes (Signed)
Chief Complaint:   Chief Complaint  Patient presents with  . Wrist Pain    History of Present Illness:   April Day is a 37 year old female who presents with a 2 month history of pain in her right wrist which radiates up her arm and into her shoulder and neck. The pain is severe at times and today is rated 10 over 10 in intensity. It's worse with movement of the neck but also with use of the arm. It seems to be worse in the wrist. She's had some swelling of the wrist and numbness and tingling of the fingers. The hand feels weak. She notes increase in pain with movement of the neck as well. She denies any injury there is no pain, paresthesias, or weakness in the left arm. She denies any chest pain or shortness of breath. The patient does not speak English well and history was obtained with the help of an interpreter. She is wearing wrist splints today and that did seem to help. The patient works as a housewife.  Review of Systems:  Other than noted above, the patient denies any of the following symptoms: Constitutional:  No fever, chills, or sweats. ENT:  No nasal congestion, sore throat, or oral ulcerations or lesions. Neck:  No swelling, or adenopathy.  Full ROM without pain. Cardiac:  No chest pain, tightness, or pressure. Respiratory:  No cough, wheezing, or dyspnea. M-S:  No joint pain, muscle pain, or back problems. Neuro:  No muscle weakness, numbness or paresthesias.  PMFSH:  Past medical history, family history, social history, meds, and allergies were reviewed.   Physical Exam:   Vital signs:  BP 111/66  Pulse 65  Temp(Src) 98 F (36.7 C) (Oral)  Resp 16  SpO2 100% General:  Alert, oriented and in no distress. Eye:  PERRL, full EOMs. ENT:  Pharynx clear, no oral lesions. Neck:  There is tenderness to palpation over the right trapezius ridge. The neck has a limited range of motion with pain. Lungs:  No respiratory distress.  Breath sounds clear and equal bilaterally.  No  wheezes, rales or rhonchi. Heart:  Regular rhythm.  No gallops, murmers, or rubs. Ext: There is diffuse tenderness to palpation in the entire right arm, most markedly over the wrist both dorsally and ventrally. There is mild swelling. She has a decreased range of motion of the wrist and pain with movement. Tinel's and Phalen's signs are positive. No upper extremity edema, pulses full.  Full ROM of joints with no joint or muscle pain to palpation. Neuro:  Alert and oriented times 3.  No focal muscle weakness.  DTRs symmetric.  Sensation intact to light touch. Skin: Clear, warm and dry.  No rash.  Good capillary refill.  Course in Urgent Care Center:    She was given Depo-Medrol 80 mg IM and for pain ibuprofen 800 mg by mouth since she will be driving home.  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 A diagnosis of Cervical radiculopathy was also pertinent to this visit.  She appears to have both carpal tunnel syndrome and cervical radiculopathy. She will need to see a hand specialist to get this all sorted out. Unfortunately she has no insurance, I don't think she'll be able to afford it.  Plan:   1.  The following meds were prescribed:   Discharge Medication List as of 01/27/2013  1:57 PM    START taking these medications   Details  meloxicam (MOBIC) 15  MG tablet Take 1 tablet (15 mg total) by mouth daily., Starting 01/27/2013, Until Discontinued, Normal    oxyCODONE-acetaminophen (PERCOCET) 5-325 MG per tablet 1 to 2 tablets every 6 hours as needed for pain., Print    predniSONE (DELTASONE) 10 MG tablet Take 2 tablets daily for 15 days, then 1 tablet daily for 15 days, Normal       2.  The patient was instructed in symptomatic care and handouts were given. 3.  The patient was told to return if becoming worse in any way, if no better in 3 or 4 days, and given some red flag symptoms such as increasing neurological symptoms that would indicate  earlier return.  Follow up:  The patient was told to follow up with Dr. Mack Hook in 2 weeks. I suggested that she wear the wrist splint in the meantime.     Reuben Likes, MD 01/27/13 [phone]

## 2013-02-12 ENCOUNTER — Ambulatory Visit (HOSPITAL_COMMUNITY)
Admission: RE | Admit: 2013-02-12 | Discharge: 2013-02-12 | Disposition: A | Discharge status: Home / Self Care | Payer: No Typology Code available for payment source | Source: Ambulatory Visit | Attending: Family Medicine | Admitting: Family Medicine

## 2013-02-12 ENCOUNTER — Ambulatory Visit (INDEPENDENT_AMBULATORY_CARE_PROVIDER_SITE_OTHER): Payer: No Typology Code available for payment source | Admitting: Family Medicine

## 2013-02-12 VITALS — BP 114/80 | Ht <= 58 in | Wt 154.0 lb

## 2013-02-12 DIAGNOSIS — M25539 Pain in unspecified wrist: Secondary | ICD-10-CM

## 2013-02-12 DIAGNOSIS — G8929 Other chronic pain: Secondary | ICD-10-CM

## 2013-02-12 DIAGNOSIS — M5412 Radiculopathy, cervical region: Secondary | ICD-10-CM

## 2013-02-12 DIAGNOSIS — M542 Cervicalgia: Secondary | ICD-10-CM | POA: Insufficient documentation

## 2013-02-12 DIAGNOSIS — M25532 Pain in left wrist: Secondary | ICD-10-CM

## 2013-02-12 NOTE — Assessment & Plan Note (Signed)
Patient may have some symptoms of cervical radicular pain to her right arm. We'll obtain a two-view neck x-ray to evaluate for degenerative disc disease. Followup in 2-3 weeks

## 2013-02-12 NOTE — Assessment & Plan Note (Signed)
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 Patient is reluctant to consider injection at this time Plan: He is a thumb spica type wrist splint to prevent ulnar deviation of the wrist.  Resume prednisone Followup in 2-3 weeks for repeat evaluation. Likely we'll perform sixth dorsal wrist compartment injection at that time.

## 2013-02-12 NOTE — Patient Instructions (Addendum)
Thank you for coming in today. Get the brace.  Get the xrays.  Come back in 2-3 weeks

## 2013-02-12 NOTE — Progress Notes (Signed)
April Day is a 37 y.o. female who presents to [facility] today for right arm and wrist pain present for the last 2-3 months. Patient notes significant on her wrist pain with motion better with rest. Additionally she notes pain radiating from her shoulder to her hand and some tingling of her entire hand. This is also worse with activity better with rest. She denies any weakness or numbness. She was seen at the urgent care on April 2013 where she was thought to have carpal tunnel syndrome. She was given prednisone, oxycodone and Phenergan. Additionally she was asked to buy wrist brace. An over-the-counter wrist brace has not been very helpful. Additionally she notes that the oxycodone and Phenergan tendon does make her sleepy. She does not think the prednisone is very effective and stopped taking it. She denies any injury and feels well otherwise.   PMH reviewed.  History  Substance Use Topics  . Smoking status: Never Smoker   . Smokeless tobacco: Not on file  . Alcohol Use: No   ROS as above otherwise neg   Exam:  BP 114/80  Ht 4\' 9"  (1.448 m)  Wt 154 lb (69.854 kg)  BMI 33.32 kg/m2 Gen: Well NAD MSK: Neck. Nontender spinal midline. Normal range of motion with mild pain. Negative Spurling's test Right wrist. Normal-appearing tender over the ulnar aspect of the wrist. Nontender in the carpal tunnel. Mild Tinel's and Phalen's test.  Pulses capillary refill strength and sensation are intact distally  Limited musculoskeletal ultrasound of the right wrist.  Carpal tunnel visualized. Normal-appearing. Median nerve measures 1.3 square centimeters. This is consistent with mild carpal tunnel syndrome The ulnar aspect of the wrist was ultrasounded the 6 compartment containing the extensor carpi ulnaris was visualized. The tendon with round the hypoechoic fluid consistent with tenosynovitis

## 2013-02-26 ENCOUNTER — Ambulatory Visit: Payer: No Typology Code available for payment source | Admitting: Family Medicine

## 2013-03-05 ENCOUNTER — Ambulatory Visit: Payer: No Typology Code available for payment source | Admitting: Family Medicine

## 2013-05-22 ENCOUNTER — Encounter (HOSPITAL_COMMUNITY): Payer: Self-pay | Admitting: Emergency Medicine

## 2013-05-22 ENCOUNTER — Emergency Department (INDEPENDENT_AMBULATORY_CARE_PROVIDER_SITE_OTHER): Payer: No Typology Code available for payment source

## 2013-05-22 ENCOUNTER — Emergency Department (INDEPENDENT_AMBULATORY_CARE_PROVIDER_SITE_OTHER)
Admission: EM | Admit: 2013-05-22 | Discharge: 2013-05-22 | Disposition: A | Discharge status: Home / Self Care | Payer: No Typology Code available for payment source | Source: Home / Self Care | Attending: Family Medicine | Admitting: Family Medicine

## 2013-05-22 DIAGNOSIS — S92301A Fracture of unspecified metatarsal bone(s), right foot, initial encounter for closed fracture: Secondary | ICD-10-CM

## 2013-05-22 DIAGNOSIS — S92309A Fracture of unspecified metatarsal bone(s), unspecified foot, initial encounter for closed fracture: Secondary | ICD-10-CM

## 2013-05-22 MED ORDER — HYDROCODONE-ACETAMINOPHEN 5-325 MG PO TABS: 1.0000 | ORAL_TABLET | Freq: Four times a day (QID) | ORAL | Status: AC | PRN

## 2013-05-22 NOTE — ED Provider Notes (Signed)
CSN: [id number]     Arrival date & time 05/22/13  1425 History     First MD Initiated Contact with Patient 05/22/13 1445     Chief Complaint  Patient presents with  . Foot Pain   (Consider location/radiation/quality/duration/timing/severity/associated sxs/prior Treatment) Patient is a 37 y.o. female presenting with foot injury. The history is provided by the patient and the spouse. The history is limited by a language barrier. Language interpreter used: spouse trans.  Foot Injury Location:  Foot Time since incident:  4 days Injury: yes   Mechanism of injury: fall   Fall:    Fall occurred:  Walking and tripped (over meter in yard.)   Impact surface:  Chartered loss adjuster of impact:  Feet Foot location:  Dorsum of R foot Pain details:    Quality:  Sharp   Severity:  Moderate   Timing:  Constant   Progression:  Unchanged Chronicity:  New Dislocation: no   Foreign body present:  No foreign bodies Associated symptoms: no back pain     History reviewed. No pertinent past medical history. Past Surgical History  Procedure Laterality Date  . Cesarean section with bilateral tubal ligation     No family history on file. History  Substance Use Topics  . Smoking status: Never Smoker   . Smokeless tobacco: Not on file  . Alcohol Use: No   OB History   Grav Para Term Preterm Abortions TAB SAB Ect Mult Living                 Review of Systems  Musculoskeletal: Positive for gait problem. Negative for back pain.  Skin: Negative.     Allergies  Review of patient's allergies indicates no known allergies.  Home Medications   Current Outpatient Rx  Name  Route  Sig  Dispense  Refill  . IBUPROFEN PO   Oral   Take by mouth.         Marland Kitchen HYDROcodone-acetaminophen (NORCO/VICODIN) 5-325 MG per tablet   Oral   Take 1 tablet by mouth every 6 (six) hours as needed for pain.   15 tablet   0   . meloxicam (MOBIC) 15 MG tablet   Oral   Take 1 tablet (15 mg total) by mouth daily.  30 tablet   0   . oxyCODONE-acetaminophen (PERCOCET) 5-325 MG per tablet      1 to 2 tablets every 6 hours as needed for pain.   20 tablet   0   . polyethylene glycol powder (GLYCOLAX/MIRALAX) powder   Oral   Take 17 g by mouth 2 (two) times daily. Until daily soft stools  OTC   255 g   0   . promethazine (PHENERGAN) 25 MG tablet   Oral   Take 1 tablet (25 mg total) by mouth every 6 (six) hours as needed for nausea.   12 tablet   0    BP 111/55  Pulse 66  Temp(Src) 97.7 F (36.5 C) (Oral)  Resp 16  SpO2 100%  LMP 05/02/2013 Physical Exam  Nursing note and vitals reviewed. Constitutional: She is oriented to person, place, and time. She appears well-developed and well-nourished.  Musculoskeletal: She exhibits tenderness.       Right foot: She exhibits bony tenderness and swelling.       Feet:  Neurological: She is alert and oriented to person, place, and time.  Skin: Skin is warm and dry.    ED Course   Procedures (including critical  care time)  Labs Reviewed - No data to display Dg Foot Complete Right  05/22/2013   *RADIOLOGY REPORT*  Clinical Data: Trauma to the foot  RIGHT FOOT COMPLETE - 3+ VIEW  Comparison: None.  Findings: No fracture  or dislocation of midfoot or forefoot.  The phalanges are normal.  The calcaneus is normal.  No soft tissue abnormality.  There is an irregularity involving the head of the fourth metatarsal.  This may represent normal overlap shadows.  IMPRESSION:  Irregularity of the head of the fourth metatarsal.  Recommend correlation with point tenderness.  This may simply represent a supposition of shadows.  Otherwise no evidence of fracture.   Original Report Authenticated By: Genevive Bi, M.D.   1. Metatarsal fracture, right, closed, initial encounter     MDM  X-rays reviewed and report per radiologist.   Linna Hoff, MD 05/22/13 1622

## 2013-05-22 NOTE — ED Notes (Signed)
Monday morning patient stepped on a cover outside.  Reports toes/foot are painful.  Bent toes back when she stepped on lid.

## 2013-10-21 ENCOUNTER — Encounter (HOSPITAL_COMMUNITY): Payer: Self-pay | Admitting: Emergency Medicine

## 2013-10-21 ENCOUNTER — Emergency Department (HOSPITAL_COMMUNITY)
Admission: EM | Admit: 2013-10-21 | Discharge: 2013-10-21 | Disposition: A | Discharge status: Home / Self Care | Payer: PRIVATE HEALTH INSURANCE | Source: Home / Self Care | Attending: Family Medicine | Admitting: Family Medicine

## 2013-10-21 DIAGNOSIS — R071 Chest pain on breathing: Secondary | ICD-10-CM

## 2013-10-21 DIAGNOSIS — J069 Acute upper respiratory infection, unspecified: Secondary | ICD-10-CM

## 2013-10-21 DIAGNOSIS — R0789 Other chest pain: Secondary | ICD-10-CM

## 2013-10-21 MED ORDER — DICLOFENAC POTASSIUM 50 MG PO TABS: 50.0000 mg | ORAL_TABLET | Freq: Three times a day (TID) | ORAL | Status: AC

## 2013-10-21 MED ORDER — GUAIFENESIN-CODEINE 100-10 MG/5ML PO SYRP: 10.0000 mL | ORAL_SOLUTION | Freq: Four times a day (QID) | ORAL | Status: AC | PRN

## 2013-10-21 NOTE — ED Provider Notes (Signed)
CSN: [id number]     Arrival date & time 10/21/13  1046 History   First MD Initiated Contact with Patient 10/21/13 1226     Chief Complaint  Patient presents with  . URI   (Consider location/radiation/quality/duration/timing/severity/associated sxs/prior Treatment) Patient is a 38 y.o. female presenting with URI. The history is provided by the patient.  URI Presenting symptoms: cough   Presenting symptoms: no fever   Severity:  Mild Onset quality:  Gradual Duration:  3 weeks Progression:  Unchanged Chronicity:  New Ineffective treatments:  None tried Associated symptoms: no wheezing     History reviewed. No pertinent past medical history. Past Surgical History  Procedure Laterality Date  . Cesarean section with bilateral tubal ligation     History reviewed. No pertinent family history. History  Substance Use Topics  . Smoking status: Never Smoker   . Smokeless tobacco: Not on file  . Alcohol Use: No   OB History   Grav Para Term Preterm Abortions TAB SAB Ect Mult Living                 Review of Systems  Constitutional: Negative.  Negative for fever.  Respiratory: Positive for cough. Negative for chest tightness, shortness of breath and wheezing.   Cardiovascular: Positive for chest pain. Negative for leg swelling.    Allergies  Review of patient's allergies indicates no known allergies.  Home Medications   Current Outpatient Rx  Name  Route  Sig  Dispense  Refill  . diclofenac (CATAFLAM) 50 MG tablet   Oral   Take 1 tablet (50 mg total) by mouth 3 (three) times daily.   30 tablet   0   . guaiFENesin-codeine (ROBITUSSIN AC) 100-10 MG/5ML syrup   Oral   Take 10 mLs by mouth 4 (four) times daily as needed for cough.   180 mL   0   . HYDROcodone-acetaminophen (NORCO/VICODIN) 5-325 MG per tablet   Oral   Take 1 tablet by mouth every 6 (six) hours as needed for pain.   15 tablet   0   . IBUPROFEN PO   Oral   Take by mouth.         . meloxicam  (MOBIC) 15 MG tablet   Oral   Take 1 tablet (15 mg total) by mouth daily.   30 tablet   0   . oxyCODONE-acetaminophen (PERCOCET) 5-325 MG per tablet      1 to 2 tablets every 6 hours as needed for pain.   20 tablet   0   . polyethylene glycol powder (GLYCOLAX/MIRALAX) powder   Oral   Take 17 g by mouth 2 (two) times daily. Until daily soft stools  OTC   255 g   0   . promethazine (PHENERGAN) 25 MG tablet   Oral   Take 1 tablet (25 mg total) by mouth every 6 (six) hours as needed for nausea.   12 tablet   0    BP 120/77  Pulse 72  Temp(Src) 98.6 F (37 C) (Oral)  Resp 18  SpO2 100%  LMP 10/21/2013 Physical Exam  Nursing note and vitals reviewed. Constitutional: She is oriented to person, place, and time. She appears well-developed and well-nourished.  HENT:  Head: Normocephalic.  Right Ear: External ear normal.  Left Ear: External ear normal.  Mouth/Throat: Oropharynx is clear and moist.  Neck: Normal range of motion. Neck supple.  Cardiovascular: Normal rate, regular rhythm, normal heart sounds and intact distal pulses.  Pulmonary/Chest: Effort normal and breath sounds normal. She exhibits tenderness.  Lymphadenopathy:    She has no cervical adenopathy.  Neurological: She is alert and oriented to person, place, and time.  Skin: Skin is warm and dry.    ED Course  Procedures (including critical care time) Labs Review Labs Reviewed - No data to display Imaging Review No results found.  EKG Interpretation    Date/Time:    Ventricular Rate:    PR Interval:    QRS Duration:   QT Interval:    QTC Calculation:   R Axis:     Text Interpretation:              MDM       Billy Fischer, MD 10/21/13 1241

## 2013-10-21 NOTE — ED Notes (Signed)
Pt  Reports  Symptoms  Of  Cough  /  Congested     Pain in  Chest    At  Times   Reports  The  Symptoms  X  3  Weeks       She  Reports  Symptoms  Of  Some  intermittant  Chills    She   Ambulated  To  Room  With a  Steady  Fluid  Gait  She  Is  Sitting  Upright    On     Exam table speaking in  Complete     sentances         She is  Masked  And  Is in a  Private  Room

## 2013-10-21 NOTE — Discharge Instructions (Signed)
Drink plenty of fluids as discussed, use medicine as prescribed, and mucinex or delsym for cough. Return or see your doctor if further problems °

## 2014-10-23 ENCOUNTER — Emergency Department (HOSPITAL_COMMUNITY)
Admission: EM | Admit: 2014-10-23 | Discharge: 2014-10-23 | Disposition: A | Discharge status: Home / Self Care | Payer: Self-pay | Attending: Emergency Medicine | Admitting: Emergency Medicine

## 2014-10-23 ENCOUNTER — Encounter (HOSPITAL_COMMUNITY): Payer: Self-pay

## 2014-10-23 DIAGNOSIS — J069 Acute upper respiratory infection, unspecified: Secondary | ICD-10-CM | POA: Insufficient documentation

## 2014-10-23 DIAGNOSIS — H9203 Otalgia, bilateral: Secondary | ICD-10-CM | POA: Insufficient documentation

## 2014-10-23 DIAGNOSIS — Z79899 Other long term (current) drug therapy: Secondary | ICD-10-CM | POA: Insufficient documentation

## 2014-10-23 DIAGNOSIS — Z791 Long term (current) use of non-steroidal anti-inflammatories (NSAID): Secondary | ICD-10-CM | POA: Insufficient documentation

## 2014-10-23 MED ORDER — IBUPROFEN 800 MG PO TABS
800.0000 mg | ORAL_TABLET | Freq: Once | ORAL | Status: AC
  Administered 2014-10-23: 800 mg via ORAL
  Filled 2014-10-23: qty 1

## 2014-10-23 MED ORDER — IBUPROFEN 600 MG PO TABS: 600.0000 mg | ORAL_TABLET | Freq: Four times a day (QID) | ORAL | Status: AC | PRN

## 2014-10-23 NOTE — ED Provider Notes (Signed)
CSN: [id number]     Arrival date & time 10/23/14  6546 History   First MD Initiated Contact with Patient 10/23/14 [phone]     Chief Complaint  Patient presents with  . Fever     (Consider location/radiation/quality/duration/timing/severity/associated sxs/prior Treatment) HPI The patient reports she's had symptoms for approximately 2 days. She reports both of her ears ache, she reports nasal drainage and headache. The patient poor she's had a fever but has not measured it. She reports that she's had a cough for about a week but the cough has improved. There is no associated shortness of breath. She reports she also has achiness between her shoulder blades and upper chest. There is no associated vomiting or diarrhea. She reports her son has similar symptoms right now. History reviewed. No pertinent past medical history. Past Surgical History  Procedure Laterality Date  . Cesarean section with bilateral tubal ligation     No family history on file. History  Substance Use Topics  . Smoking status: Never Smoker   . Smokeless tobacco: Not on file  . Alcohol Use: No   OB History    No data available     Review of Systems  10 Systems reviewed and are negative for acute change except as noted in the HPI.   Allergies  Review of patient's allergies indicates no known allergies.  Home Medications   Prior to Admission medications   Medication Sig Start Date End Date Taking? Authorizing Provider  diclofenac (CATAFLAM) 50 MG tablet Take 1 tablet (50 mg total) by mouth 3 (three) times daily. 10/21/13   Billy Fischer, MD  guaiFENesin-codeine [facility]) 100-10 MG/5ML syrup Take 10 mLs by mouth 4 (four) times daily as needed for cough. 10/21/13   Billy Fischer, MD  HYDROcodone-acetaminophen (NORCO/VICODIN) 5-325 MG per tablet Take 1 tablet by mouth every 6 (six) hours as needed for pain. 05/22/13   Billy Fischer, MD  ibuprofen (ADVIL,MOTRIN) 600 MG tablet Take 1 tablet (600 mg total) by mouth  every 6 (six) hours as needed. 10/23/14   Charlesetta Shanks, MD  IBUPROFEN PO Take by mouth.    Historical Provider, MD  meloxicam (MOBIC) 15 MG tablet Take 1 tablet (15 mg total) by mouth daily. 01/27/13   Harden Mo, MD  oxyCODONE-acetaminophen (PERCOCET) 5-325 MG per tablet 1 to 2 tablets every 6 hours as needed for pain. 01/27/13   Harden Mo, MD  polyethylene glycol powder (GLYCOLAX/MIRALAX) powder Take 17 g by mouth 2 (two) times daily. Until daily soft stools  OTC 12/26/12   Alvina Chou, PA-C  promethazine (PHENERGAN) 25 MG tablet Take 1 tablet (25 mg total) by mouth every 6 (six) hours as needed for nausea. 12/26/12   Kaitlyn Szekalski, PA-C   BP 103/62 mmHg  Pulse 67  Temp(Src) 98.6 F (37 C) (Oral)  Resp 16  SpO2 98%  LMP 10/13/2014 Physical Exam  Constitutional: She is oriented to person, place, and time. She appears well-developed and well-nourished.  HENT:  Head: Normocephalic and atraumatic.  Right Ear: External ear normal.  Left Ear: External ear normal.  Nose: Nose normal.  Mouth/Throat: Oropharynx is clear and moist. No oropharyngeal exudate.  Bilateral TMs are normal with mild tympano-sclerosis no effusion or erythema. The posterior oropharynx is widely patent without exudate or erythema. Neck is supple without lymphadenopathy.  Eyes: EOM are normal. Pupils are equal, round, and reactive to light.  Neck: Neck supple.  Cardiovascular: Normal rate, regular rhythm, normal heart sounds  and intact distal pulses.   Pulmonary/Chest: Effort normal and breath sounds normal.  Abdominal: Soft. Bowel sounds are normal. She exhibits no distension. There is no tenderness.  Musculoskeletal: Normal range of motion. She exhibits no edema.  Neurological: She is alert and oriented to person, place, and time. She has normal strength. Coordination normal. GCS eye subscore is 4. GCS verbal subscore is 5. GCS motor subscore is 6.  Skin: Skin is warm, dry and intact.  Psychiatric:  She has a normal mood and affect.    ED Course  Procedures (including critical care time) Labs Review Labs Reviewed - No data to display  Imaging Review No results found.   EKG Interpretation None      MDM   Final diagnoses:  URI (upper respiratory infection)   The patient present with symptoms consistent with upper respiratory infection. Her physical examination is normal and vital signs are stable. Review of the EMR shows the patient was seen 2 days ago and treated symptomatically for URI. At this point her general condition is good there are no signs of toxicity or respiratory distress. I feel she is safe to continue with treatment for likely viral URI.    Charlesetta Shanks, MD 10/23/14 [phone]

## 2014-10-23 NOTE — Discharge Instructions (Signed)
Infeccin de las vas areas superiores en los adultos (Upper Respiratory Infection, Adult)  La infeccin respiratoria de las vas areas superiores se conoce tambin como resfro comn. Las vas areas superiores [employer] senos nasales, la garganta, la trquea, y los bronquios. Los bronquios son las vas areas que conducen el aire a los pulmones. La mayor parte de las personas mejora luego de una [place], Armed forces training and education officer los sntomas pueden durar Walt Disney. La tos residual puede durar ms. CAUSAS Varios tipos de virus pueden causar la infeccin de los tejidos que cubren las vas areas superiores. Los tejidos se irritan y se inflaman y se originan secreciones. Tambin es frecuente la produccin de moco. El resfro es contagioso. El virus se disemina fcilmente a otras personas por contacto oral. Aqu se incluyen los besos, el compartir un vaso y el toser o Brewing technologist. Tambin puede diseminarse tocndose la boca o la [place] y luego tocando una superficie que luego tocan Producer, television/film/video.  SNTOMAS Los sntomas se desarrollan entre uno y tres das luego de Dietitian en contacto con el virus. Pueden variar de Ardelia Mems persona a otra. Incluyen:  Secrecin nasal.  Estornudos  Congestin nasal.  Irritacin de los senos nasales.  Dolor de Investment banker, operational.  Prdida de la voz (laringitis).  Tos.  Fatiga.  Dolores musculares.  Prdida del apetito.  Dolor de [place].  Fiebre no muy elevada. DIAGNSTICO Puede diagnosticarse a s mismo la infeccin respiratoria, segn los sntomas habituales, ya que la mayor parte de las personas se resfra dos o tres veces al ao. El profesional puede confirmarlo basndose en el examen fsico. Lo ms importante es que el profesional verifique que los sntomas no se deben a otra enfermedad como anginas, sinusitis, neumona, asma o epiglotitis. Para diagnosticar el resfrio comn, no es necesario que haga anlisis de [place], pruebas en la garganta o radiografas, pero en algunos  casos puede ser de utilidad para excluir otros problemas ms graves. El mdico decidir si necesita otras pruebas. RIESGOS Y COMPLICACIONES Tendr mayor riesgo de sufrir un resfro grave si consume cigarrillos, sufre una enfermedad cardaca (como insuficiencia cardaca) o pulmonar crnica (como asma) o si tiene un debilitamiento del sistema inmunolgico. Las personas muy jvenes o muy mayores tienen riesgo de sufrir infecciones ms graves. La sinusitis bacteriana, las infecciones del odo medio y la neumona bacteriana pueden complicar el resfro comn. El resfro puede exacerbar el asma y la enfermedad pulmonar obstructiva crnica. En algunos casos estas complicaciones requieren la atencin en un servicio de emergencias y pueden poner en peligro la vida. PREVENCIN La mejor manera de protegerse para no contraer un resfro es Theatre manager una buena higiene. Evite el contacto bucal o de las manos con personas con sntomas de resfro. Si se produce el contacto, lvese las manos con frecuencia. No hay pruebas firmes que indiquen que la vitamina C, la vitamina E, la equincea o la actividad fsica reduzcan las posibilidades de tener una infeccin. Sin embargo, siempre se recomienda Scientific laboratory technician y Lucilla Edin buena nutricin. TRATAMIENTO El tratamiento est dirigido a Herbalist sntomas. Esta enfermedad no tiene [place]. Los antibiticos no son eficaces, ya que esta infeccin la causa un virus y no una bacteria. El tratamiento incluye:  Aumente la ingesta de lquidos. Consumo de bebidas deportivas, que proporcionan electrolitos,azcares e hidratacin.  Inhale vapor caliente (de un vaporizador o de la ducha).  Tomar sopa de pollo u otros lquidos claros, y Campbell Soup buena nutricin.  Descanse lo suficiente.  Haga grgaras o coma pastillas para Public house manager  las molestias.  Control de la fiebre con ibuprofeno o acetaminofen, segn las indicaciones del mdico.  Aumento del uso del inhalador, si sufre asma. Las  pastillas y los geles de zinc durante las primeras 24 horas de iniciado el resfro comn, pueden disminuir la duracin y Public house manager la gravedad de los sntomas. Los medicamentos para Conservation officer, historic buildings pueden disminuir la fiebre, Public house manager los dolores musculares y Conservation officer, historic buildings de Investment banker, operational. Se dispone de una gran variedad de medicamentos de venta libre para tratar la congestin y la secrecin nasal. El profesional podr recomendarle inhalantes para los otros sntomas. INSTRUCCIONES PARA EL CUIDADO DOMICILIARIO  Utilice los medicamentos de venta libre o de prescripcin para Conservation officer, historic buildings, el malestar o la [place], segn se lo indique el profesional que lo asiste.  Utilice un vaporizador caliente o inhale vapor, haciendo salir agua de la ducha para aumentar la humedad [employer]. Esto mantendr las secreciones hmedas y Arts development officer ms fcil respirar.  Beba gran cantidad de lquido para mantener la orina de tono claro o color amarillo plido.  Descanse todo lo que pueda.  Regrese a su trabajo cuando la temperatura se haya normalizado, o cuando el profesional que lo asiste se lo indique. Quizs sea necesario que permanezca en su casa durante un tiempo ms prolongado para Industrial/product designer a Producer, television/film/video. Tambin puede utilizar un barbijo y ser cuidadoso con el lavado de manos para evitar la diseminacin del virus. SOLICITE ATENCIN MDICA SI:  Luego de los primeros das siente que empeora en vez de [place].  Necesita que Software engineer brinde ms informacin relacionada con los medicamentos para [employer] sntomas.  Siente escalofros, le falta el aire o escupe moco de color marrn o rojo. Estos pueden ser sntomas de neumona.  Tiene una secrecin nasal de color amarillo o marrn, o siente dolor en el rostro, especialmente cuando se inclina hacia adelante. Estos pueden ser sntomas de sinusitis.  Tiene fiebre, siente el cuello hinchado, tiene dolor al tragar u observa manchas blancas en el fondo de la garganta.  Estos pueden ser sntomas de angina por estreptococo. April Day ATENCIN MDICA DE INMEDIATO SI:  April Day.  Comienza a sentir Clinical cytogeneticist de cabeza intenso o persistente, dolor de odos, en el seno nasal o en el pecho.  Tiene tos y esta se prolonga demasiado, tose y escupe sangre, la mucosidad habitual se modifica (si tiene una enfermedad pulmonar crnica) o respira con dificultad.  Siente rigidez en el cuello o dolor de cabeza intenso. Document Released: 06/28/2005 Document Revised: 12/11/2011 [facility] Patient Information 2015 [place]. This information is not intended to replace advice given to you by your health care provider. Make sure you discuss any questions you have with your health care provider.  Emergency Department Resource Guide 1) Find a Doctor and Pay Out of Pocket Although you won't have to find out who is covered by your insurance plan, it is a good idea to ask around and get recommendations. You will then need to call the office and see if the doctor you have chosen will accept you as a new patient and what types of options they offer for patients who are self-pay. Some doctors offer discounts or will set up payment plans for their patients who do not have insurance, but you will need to ask so you aren't surprised when you get to your appointment.  2) Contact Your Local Health Department Not all health departments have doctors that can see patients for sick visits, but many do, so it  is worth a call to see if yours does. If you don't know where your local health department is, you can check in your phone book. The CDC also has a tool to help you locate your state's health department, and many state websites also have listings of all of their local health departments.  3) Find a [facility] If your illness is not likely to be very severe or complicated, you may want to try a walk in clinic. These are popping up all over the country in pharmacies, drugstores, and  shopping centers. They're usually staffed by nurse practitioners or physician assistants that have been trained to treat common illnesses and complaints. They're usually fairly quick and inexpensive. However, if you have serious medical issues or chronic medical problems, these are probably not your best option.  No Primary Care Doctor: - Call Health Connect at  [phone] - they can help you locate a primary care doctor that  accepts your insurance, provides certain services, etc. - Physician Referral Service- [phone]  Chronic Pain Problems: Organization         Address  Phone   Notes  [facility]  [phone] Patients need to be referred by their primary care doctor.   Medication Assistance: Organization         Address  Phone   Notes  [facility] Medication [facility] [place] [place], [place] [address] [phone] --Must be a resident of [facility] -- Must have NO insurance coverage whatsoever (no Medicaid/ Medicare, etc.) -- The pt. MUST have a primary care doctor that directs their care regularly and follows them in the community   MedAssist  [phone]   [employer]  [phone]    Agencies that provide inexpensive medical care: Organization         Address  Phone   Notes  [place]  [phone]   [facility]    [phone]   [facility] [place], [place] [address] [phone]   [place] [address] [place] [phone]   Planned Parenthood    [phone]   [facility]    [phone]   [place] and [place] [address], [place] Phone:  [phone], Fax:  [phone] Hours of Operation:  9 am - 6 pm, M-F.  Also accepts Medicaid/Medicare and self-pay.  [facility] for [place], [address], [place] Phone: [phone],  Fax: [phone]. Hours of Operation:  8:30 am - 5:30 pm, M-F.  Also accepts Medicaid and self-pay.  [facility] [place] [address] [place] Phone: [phone]   [place], [place], [place] [phone], Ext. [phone] Mondays & Thursdays: 7-9 AM.  First 15 patients are seen on a first come, first serve basis.    [place] Providers:  Organization         Address  Phone   Notes  [facility] [address], [place] [phone] Also accepts self-pay patients.  [place], [place]  [phone]   [place], [address], [place] [phone]   [place] [address] [place] [phone]   Lucianne Lei [address]  [address], [place]   [phone] Only accepts [place] patients after they have their name applied to their card.   Self-Pay (no insurance) in [facility]:  Organization         Address  Phone   Notes  Sickle Cell Patients, [facility] Internal Medicine [place] [phone]   [facility] Urgent Care [place] [phone]   [facility] [place]  [place], [place], [place] [phone]   [facility]/Dr. Osei-Bonsu  [address], [place] or [address], [place] [phone] Phone number for both [place] and [place] locations is the same.  Urgent Medical and [facility] [address] [place] [phone]   [facility] [address] [place] or [address] [phone]   [facility] [address], [place] [phone], phone; [phone], fax Sees patients 1st and 3rd Saturday of every month.  Must not qualify for public or private  insurance (i.e. Medicaid, Medicare, [place] Health Choice, Veterans' Benefits)  Household income should be no more than 200% of the poverty level The clinic cannot treat you if you are pregnant or think you are pregnant  Sexually transmitted diseases are not treated at the clinic.    Dental Care: Organization         Address  Phone  Notes  [facility] Department of [facility] [place] [phone] Accepts children up to age 61 who are enrolled in [place] or [place]; pregnant women with a Medicaid card; and children who have applied for Medicaid or [place] Health Choice, but were declined, whose parents can pay a reduced fee at time of service.  [facility] Department of [facility]  [address], [place] [phone] Accepts children up to age 32 who are enrolled in [place] or [place]; pregnant women with a Medicaid card; and children who have applied for Medicaid or [place] Health Choice, but were declined, whose parents can pay a reduced fee at time of service.  [facility] Adult [facility] Access PROGRAM  [place] [phone] Patients are seen by appointment only. Walk-ins are not accepted. [place] will see patients 20 years of age and older. Monday - Tuesday (8am-5pm) Most Wednesdays (8:30-5pm) $30 per visit, cash only  [facility] Adult Dental Access PROGRAM  [address], [facility] [phone] Patients are seen by appointment only. Walk-ins are not accepted. [place] will see patients 38 years of age and older. One Wednesday Evening (Monthly: Volunteer Based).  $30 per visit, cash only  [place]  [phone] for adults; Children under age 69, call Graduate Pediatric Dentistry at [phone]. Children aged 59-14, please call [phone] to request a pediatric application.  Dental services are provided in all areas of dental care  including fillings, crowns and bridges, complete and partial dentures, implants, gum treatment, root canals, and extractions. Preventive care is also provided. Treatment is provided to both adults and children. Patients are selected via a lottery and there is often a waiting list.   [facility] [address], [place]  [phone] [email or web address]   [facility] [address] [place], [place] [phone], Ext. [phone] Second and Fourth Thursday  of each month, opens at 6:30 AM; Clinic ends at 9 AM.  Patients are seen on a first-come first-served basis, and a limited number are seen during each clinic.   [facility]  [address] Hillard Danker [place], [place] [phone]   Eligibility Requirements You must have lived in [place], [place], or [place] counties for at least the last three months.   You cannot be eligible for state or federal sponsored [employer], including [employer], [place], or [employer].   You generally cannot be eligible for healthcare insurance through your employer.    How to apply: Eligibility screenings are held every Tuesday and Wednesday afternoon from 1:00 pm until 4:00 pm. You do not need an appointment for the interview!  [facility] [address] [place], [place]   [place]  [place] Department  [place]  [phone]    Behavioral Health Resources in the Community: Intensive Outpatient Programs Organization         Address  Phone  Notes  [place]. [address] [place], [place] [phone]   [facility] Outpatient [address] [place], [place]   ADS: Alcohol & Drug Svcs [address] [place], [place]   [place] [address],  [place], [place] or [phone]     Substance Abuse Resources Organization         Address  Phone  Notes  Alcohol and Drug Services  [phone]   [place]  [phone]   [facility]   Chinita Pester  [phone]   Residential & Outpatient Substance Abuse Program  [phone]   Psychological Services Organization         Address  Phone  Notes  [facility] [place]  [place]  [phone]   [place] [address] [place] or [phone]    Mobile Crisis Teams Organization         Address  Phone  Notes  [facility], Mobile Crisis Care Unit  [phone]   Assertive Psychotherapeutic Services  [address] [place], [place]   [place] [address] [place] [phone]    Self-Help/Support Groups Organization         Address  Phone             Notes  [place]. of [place] - variety of support groups  [place] Call for more information  Narcotics Anonymous (NA), [facility] [address], [employer] [place]  2 meetings at this location   Special educational needs teacher         Address  Phone  Notes  [facility]AP Residential Treatment [place],    [place]  [phone]   [facility]  [address] [place] [address], [place], [place]   [place], [place] [phone] Admissions: 8am-3pm M-F  Incentives Substance [place] [address],    [place], [place] [phone]   [facility] [address] Jadene Pierini [place], [place]   The [place]  [place], [place] - Intensive Outpatient [address] Kristeen Mans [address], [place], [place]   [facility] ([place],  [place], [place] [phone] or [phone]   Residential Treatment  Services (RTS) [address] [place], [place] Accepts Medicaid  [facility] [place] [address],  [place] [phone] Substance Abuse/Addiction Treatment   [facility] Organization         Address  Phone  Notes  [facility]  [phone]   Domenic Schwab, PhD [address] Arlis Porta [place], [place]   [phone] or [phone]   Louise Robeson [place], [place] [phone]   [address], [place], [place] [phone] Insurance/Medicaid/sponsorship through [facility] and Families [address], [address] [place]                                    [place], [place] [phone] [place] [address], [place] [phone]    Dr. Adele Schilder  [phone]   [facility] Dept. 1) [address] [place] 2) [place] 3)  [place] [address], [place] [phone]  [phone]   [place] [phone] or [phone] (After Hours)

## 2014-10-23 NOTE — ED Notes (Addendum)
Pt. Presents to ED with complaint of fever x2 days with back and chest discomfort with deep breaths. Bilateral ear pain. Pt. States she had a cough a week ago and has since resolved. Denies SOB/N/V/D. Pt. Temp 98.6 on arrival. Pt. Took tylenol around 0400 today.

## 2015-05-31 ENCOUNTER — Ambulatory Visit: Payer: Self-pay
